# Patient Record
Sex: Female | Born: 1955
Health system: Southern US, Community
[De-identification: ages and names within clinical notes are randomized; demographics above are authoritative.]

## PROBLEM LIST (undated history)

## (undated) DIAGNOSIS — J45909 Unspecified asthma, uncomplicated: Secondary | ICD-10-CM

## (undated) DIAGNOSIS — I10 Essential (primary) hypertension: Secondary | ICD-10-CM

## (undated) HISTORY — PX: CHOLECYSTECTOMY: SHX55

## (undated) HISTORY — PX: MANDIBLE SURGERY: SHX707

---

## 1998-02-14 ENCOUNTER — Other Ambulatory Visit: Admission: RE | Admit: 1998-02-14 | Discharge: 1998-02-14 | Payer: Self-pay | Admitting: Obstetrics and Gynecology

## 1998-03-22 ENCOUNTER — Ambulatory Visit (HOSPITAL_COMMUNITY): Admission: RE | Admit: 1998-03-22 | Discharge: 1998-03-22 | Payer: Self-pay | Admitting: Obstetrics and Gynecology

## 1998-03-22 ENCOUNTER — Encounter: Payer: Self-pay | Admitting: Obstetrics and Gynecology

## 1998-06-06 ENCOUNTER — Encounter: Payer: Self-pay | Admitting: Vascular Surgery

## 1998-06-08 ENCOUNTER — Ambulatory Visit (HOSPITAL_COMMUNITY): Admission: RE | Admit: 1998-06-08 | Discharge: 1998-06-08 | Payer: Self-pay | Admitting: Vascular Surgery

## 1999-11-18 ENCOUNTER — Encounter: Admission: RE | Admit: 1999-11-18 | Discharge: 1999-11-18 | Payer: Self-pay | Admitting: *Deleted

## 1999-11-18 ENCOUNTER — Encounter: Payer: Self-pay | Admitting: *Deleted

## 2000-09-06 ENCOUNTER — Encounter: Payer: Self-pay | Admitting: Emergency Medicine

## 2000-09-06 ENCOUNTER — Emergency Department (HOSPITAL_COMMUNITY): Admission: EM | Admit: 2000-09-06 | Discharge: 2000-09-06 | Payer: Self-pay | Admitting: Emergency Medicine

## 2001-11-09 ENCOUNTER — Encounter: Admission: RE | Admit: 2001-11-09 | Discharge: 2001-12-27 | Payer: Self-pay | Admitting: Orthopedic Surgery

## 2001-11-16 ENCOUNTER — Other Ambulatory Visit: Admission: RE | Admit: 2001-11-16 | Discharge: 2001-11-16 | Payer: Self-pay | Admitting: Obstetrics and Gynecology

## 2002-07-29 ENCOUNTER — Ambulatory Visit (HOSPITAL_COMMUNITY): Admission: RE | Admit: 2002-07-29 | Discharge: 2002-07-29 | Payer: Self-pay | Admitting: Obstetrics and Gynecology

## 2002-07-29 ENCOUNTER — Encounter (INDEPENDENT_AMBULATORY_CARE_PROVIDER_SITE_OTHER): Payer: Self-pay

## 2004-08-27 ENCOUNTER — Encounter: Admission: RE | Admit: 2004-08-27 | Discharge: 2004-08-27 | Payer: Self-pay | Admitting: Obstetrics and Gynecology

## 2007-03-18 ENCOUNTER — Encounter: Admission: RE | Admit: 2007-03-18 | Discharge: 2007-03-18 | Payer: Self-pay | Admitting: Obstetrics and Gynecology

## 2008-04-26 ENCOUNTER — Encounter: Admission: RE | Admit: 2008-04-26 | Discharge: 2008-04-26 | Payer: Self-pay | Admitting: Obstetrics and Gynecology

## 2009-06-23 HISTORY — PX: ELBOW SURGERY: SHX618

## 2009-07-01 ENCOUNTER — Emergency Department (HOSPITAL_COMMUNITY): Admission: EM | Admit: 2009-07-01 | Discharge: 2009-07-01 | Payer: Self-pay | Admitting: Emergency Medicine

## 2010-02-07 ENCOUNTER — Encounter: Admission: RE | Admit: 2010-02-07 | Discharge: 2010-02-07 | Payer: Self-pay | Admitting: Obstetrics and Gynecology

## 2010-08-23 ENCOUNTER — Emergency Department (HOSPITAL_COMMUNITY)
Admission: EM | Admit: 2010-08-23 | Discharge: 2010-08-23 | Disposition: A | Discharge status: Home / Self Care | Payer: BC Managed Care – PPO | Attending: Emergency Medicine | Admitting: Emergency Medicine

## 2010-08-23 DIAGNOSIS — T7840XA Allergy, unspecified, initial encounter: Secondary | ICD-10-CM | POA: Insufficient documentation

## 2010-08-23 DIAGNOSIS — L299 Pruritus, unspecified: Secondary | ICD-10-CM | POA: Insufficient documentation

## 2010-08-23 DIAGNOSIS — J45909 Unspecified asthma, uncomplicated: Secondary | ICD-10-CM | POA: Insufficient documentation

## 2010-08-23 DIAGNOSIS — Y929 Unspecified place or not applicable: Secondary | ICD-10-CM | POA: Insufficient documentation

## 2010-08-23 DIAGNOSIS — I10 Essential (primary) hypertension: Secondary | ICD-10-CM | POA: Insufficient documentation

## 2010-11-08 NOTE — Op Note (Signed)
   NAME:  Mckenzie Whitehead, Mckenzie Whitehead                     ACCOUNT NO.:  [id number]   MEDICAL RECORD NO.:  [id number]                   PATIENT TYPE:  AMB   LOCATION:  SDC                                  FACILITY:  [facility]   PHYSICIAN:  Maxie Better, M.D.            DATE OF BIRTH:  10-Jan-1956   DATE OF PROCEDURE:  07/29/2002  DATE OF DISCHARGE:                                 OPERATIVE REPORT   PREOPERATIVE DIAGNOSES:  1. Dysfunctional uterine bleeding.  2. Endocervical mass.   PROCEDURES:  1. Diagnostic hysteroscopy.  2. Dilatation and curettage.   POSTOPERATIVE DIAGNOSIS:  Dysfunctional uterine bleeding.   ANESTHESIA:  General, paracervical block.   SURGEON:  Maxie Better, M.D.   PROCEDURE:  Under adequate general anesthesia, the patient was placed in the  dorsal lithotomy position.  Examination under anesthesia revealed a small  anteverted uterus.  No adnexal masses could be appreciated.  The patient was  sterilely prepped and draped in the usual fashion.  The bladder was  catheterized of small amount of urine.  A bivalve speculum was placed in the  vagina.  Nesacaine 1%, 10 mL, was injected paracervically at 3 and 9  o'clock.  The anterior lip of the cervix was grasped with a single-tooth  tenaculum.  The cervical os was then serially dilated up to a 25 Pratt  dilator.  A diagnostic hysteroscope was introduced into the uterine cavity  without incident.  Careful inspection including the endocervical canal and  the endometrial cavity did not reveal any masses.  The left tubal ostia was  seen.  The right was not seen.  The hysteroscope was removed.  The cavity  was then curetted.  The hysteroscope was then reinserted in the uterine  cavity and reinspection occurred without any additional findings.  At this  point, all instruments were removed from the vagina.  Specimen labeled  endometrial curetting was sent to pathology.  Estimated blood loss was  minimal.  Complications  were none.  The patient tolerated the procedure  well, was transferred to the recovery room in stable condition.                                               Maxie Better, M.D.    [place]  D:  07/29/2002  T:  07/29/2002  Job:  [id number]

## 2011-02-04 ENCOUNTER — Other Ambulatory Visit: Payer: Self-pay | Admitting: Obstetrics and Gynecology

## 2011-02-04 DIAGNOSIS — N6320 Unspecified lump in the left breast, unspecified quadrant: Secondary | ICD-10-CM

## 2011-02-10 ENCOUNTER — Ambulatory Visit
Admission: RE | Admit: 2011-02-10 | Discharge: 2011-02-10 | Disposition: A | Discharge status: Home / Self Care | Payer: BC Managed Care – PPO | Source: Ambulatory Visit | Attending: Obstetrics and Gynecology | Admitting: Obstetrics and Gynecology

## 2011-02-10 DIAGNOSIS — N6320 Unspecified lump in the left breast, unspecified quadrant: Secondary | ICD-10-CM

## 2011-03-16 ENCOUNTER — Emergency Department (HOSPITAL_COMMUNITY)
Admission: EM | Admit: 2011-03-16 | Discharge: 2011-03-16 | Disposition: A | Discharge status: Home / Self Care | Payer: BC Managed Care – PPO | Attending: Emergency Medicine | Admitting: Emergency Medicine

## 2011-03-16 DIAGNOSIS — J45909 Unspecified asthma, uncomplicated: Secondary | ICD-10-CM | POA: Insufficient documentation

## 2011-03-16 DIAGNOSIS — S91109A Unspecified open wound of unspecified toe(s) without damage to nail, initial encounter: Secondary | ICD-10-CM | POA: Insufficient documentation

## 2011-03-16 DIAGNOSIS — IMO0002 Reserved for concepts with insufficient information to code with codable children: Secondary | ICD-10-CM | POA: Insufficient documentation

## 2011-11-26 ENCOUNTER — Emergency Department (HOSPITAL_COMMUNITY)
Admission: EM | Admit: 2011-11-26 | Discharge: 2011-11-26 | Disposition: A | Discharge status: Home / Self Care | Payer: Worker's Compensation | Attending: Emergency Medicine | Admitting: Emergency Medicine

## 2011-11-26 ENCOUNTER — Encounter (HOSPITAL_COMMUNITY): Payer: Self-pay | Admitting: Family Medicine

## 2011-11-26 ENCOUNTER — Emergency Department (HOSPITAL_COMMUNITY): Payer: Worker's Compensation

## 2011-11-26 DIAGNOSIS — S8000XA Contusion of unspecified knee, initial encounter: Secondary | ICD-10-CM

## 2011-11-26 DIAGNOSIS — J45909 Unspecified asthma, uncomplicated: Secondary | ICD-10-CM | POA: Insufficient documentation

## 2011-11-26 DIAGNOSIS — R079 Chest pain, unspecified: Secondary | ICD-10-CM | POA: Insufficient documentation

## 2011-11-26 DIAGNOSIS — S7000XA Contusion of unspecified hip, initial encounter: Secondary | ICD-10-CM | POA: Insufficient documentation

## 2011-11-26 DIAGNOSIS — Y990 Civilian activity done for income or pay: Secondary | ICD-10-CM | POA: Insufficient documentation

## 2011-11-26 DIAGNOSIS — I10 Essential (primary) hypertension: Secondary | ICD-10-CM | POA: Insufficient documentation

## 2011-11-26 DIAGNOSIS — M25569 Pain in unspecified knee: Secondary | ICD-10-CM | POA: Insufficient documentation

## 2011-11-26 DIAGNOSIS — M25559 Pain in unspecified hip: Secondary | ICD-10-CM | POA: Insufficient documentation

## 2011-11-26 DIAGNOSIS — R296 Repeated falls: Secondary | ICD-10-CM | POA: Insufficient documentation

## 2011-11-26 DIAGNOSIS — S20219A Contusion of unspecified front wall of thorax, initial encounter: Secondary | ICD-10-CM

## 2011-11-26 HISTORY — DX: Essential (primary) hypertension: I10

## 2011-11-26 HISTORY — DX: Unspecified asthma, uncomplicated: J45.909

## 2011-11-26 MED ORDER — IBUPROFEN 600 MG PO TABS: 600.0000 mg | ORAL_TABLET | Freq: Four times a day (QID) | ORAL | Status: AC | PRN

## 2011-11-26 MED ORDER — HYDROCODONE-ACETAMINOPHEN 5-325 MG PO TABS: 1.0000 | ORAL_TABLET | ORAL | Status: AC | PRN

## 2011-11-26 MED ORDER — HYDROCODONE-ACETAMINOPHEN 5-325 MG PO TABS
1.0000 | ORAL_TABLET | Freq: Once | ORAL | Status: AC
  Administered 2011-11-26: 1 via ORAL
  Filled 2011-11-26: qty 1

## 2011-11-26 MED ORDER — METHOCARBAMOL 500 MG PO TABS: 500.0000 mg | ORAL_TABLET | Freq: Two times a day (BID) | ORAL | Status: AC

## 2011-11-26 NOTE — ED Notes (Signed)
Patient states she was at work [employer]), caught foot on carpet and fell. Denies LOC. C/o right shoulder pain, right rib pain, right hip and right knee.

## 2011-11-26 NOTE — ED Notes (Signed)
Patient transported to X-ray 

## 2011-11-26 NOTE — ED Provider Notes (Signed)
History     CSN: [id number]  Arrival date & time 11/26/11  1191   First MD Initiated Contact with Patient 11/26/11 2203      Chief Complaint  Patient presents with  . Fall    (Consider location/radiation/quality/duration/timing/severity/associated sxs/prior treatment) HPI Pt with trip and fall from standing position onto right side today at work. No LOC, head or neck injury. No focal weakness, numbness. Pt c/o R side rib pain, R hip pain, R knee pain. No abd pain.  Past Medical History  Diagnosis Date  . Asthma   . Hypertension     Past Surgical History  Procedure Date  . Elbow surgery 2011  . Mandible surgery   . Cholecystectomy     No family history on file.  History  Substance Use Topics  . Smoking status: Not on file  . Smokeless tobacco: Not on file  . Alcohol Use: Yes    OB History    Grav Para Term Preterm Abortions TAB SAB Ect Mult Living                  Review of Systems  Constitutional: Negative for fever and chills.  HENT: Negative for neck pain and neck stiffness.   Eyes: Negative for visual disturbance.  Respiratory: Negative for cough and shortness of breath.   Cardiovascular: Negative for chest pain, palpitations and leg swelling.  Gastrointestinal: Negative for nausea, vomiting, abdominal pain and diarrhea.  Musculoskeletal: Positive for arthralgias. Negative for back pain.  Skin: Positive for wound. Negative for pallor and rash.  Neurological: Negative for dizziness, syncope, weakness, light-headedness, numbness and headaches.    Allergies  Review of patient's allergies indicates no known allergies.  Home Medications   Current Outpatient Rx  Name Route Sig Dispense Refill  . ALBUTEROL SULFATE HFA 108 (90 BASE) MCG/ACT IN AERS Inhalation Inhale 2 puffs into the lungs every 6 (six) hours as needed. Shortness of breath    . FEXOFENADINE HCL 30 MG PO TABS Oral Take 30 mg by mouth 2 (two) times daily.    Marland Kitchen FLUTICASONE-SALMETEROL 115-21  MCG/ACT IN AERO Inhalation Inhale 2 puffs into the lungs 2 (two) times daily.    Marland Kitchen LISINOPRIL 10 MG PO TABS Oral Take 10 mg by mouth daily.    Marland Kitchen MELATONIN 1 MG PO TABS Oral Take 1 tablet by mouth every evening.    . ADULT MULTIVITAMIN W/MINERALS CH Oral Take 1 tablet by mouth daily.    Marland Kitchen HYDROCODONE-ACETAMINOPHEN 5-325 MG PO TABS Oral Take 1 tablet by mouth every 4 (four) hours as needed for pain. 15 tablet 0  . IBUPROFEN 600 MG PO TABS Oral Take 1 tablet (600 mg total) by mouth every 6 (six) hours as needed for pain. 30 tablet 0  . METHOCARBAMOL 500 MG PO TABS Oral Take 1 tablet (500 mg total) by mouth 2 (two) times daily. 20 tablet 0    BP 150/68  Pulse 87  Temp(Src) 97.9 F (36.6 C) (Oral)  Resp 18  SpO2 98%  Physical Exam  Nursing note and vitals reviewed. Constitutional: She is oriented to person, place, and time. She appears well-developed and well-nourished. No distress.  HENT:  Head: Normocephalic and atraumatic.  Mouth/Throat: Oropharynx is clear and moist.  Eyes: EOM are normal. Pupils are equal, round, and reactive to light.  Neck: Normal range of motion. Neck supple.       No posterior midline cervical TTP. FROM  Cardiovascular: Normal rate and regular rhythm.   Pulmonary/Chest:  Effort normal and breath sounds normal. No respiratory distress. She has no wheezes. She has no rales. She exhibits tenderness (TTP over anterior/lateral R chest wall. No crepitance or deformity. ).  Abdominal: Soft. Bowel sounds are normal. She exhibits no distension. There is no tenderness. There is no rebound and no guarding.  Musculoskeletal: Normal range of motion. She exhibits no edema and no tenderness.       TTP over lateral surface of R hip. ROM of R hip. TTP over lateral surface of R knee. FROM. No laxity. 2+DP pulses.   Neurological: She is alert and oriented to person, place, and time.       5/5 motor in all ext. Sensation intact  Skin: Skin is warm and dry. No rash noted. No erythema.    Psychiatric: She has a normal mood and affect. Her behavior is normal.    ED Course  Procedures (including critical care time)  Labs Reviewed - No data to display Dg Ribs Unilateral W/chest Right  11/26/2011  *RADIOLOGY REPORT*  Clinical Data: History of fall complaining of right-sided chest pain.  RIGHT RIBS AND CHEST - 3+ VIEW  Comparison: No priors.  Findings: Lung volumes are normal.  No consolidative airspace disease.  No pleural effusions.  No pneumothorax.  No pulmonary nodule or mass noted.  Pulmonary vasculature and the cardiomediastinal silhouette are within normal limits.  Dedicated views of the right ribs demonstrate no definite acute displaced rib fractures.  Surgical clips project over the right upper quadrant of the abdomen, suggesting prior cholecystectomy.  IMPRESSION: 1.  No radiographic evidence of acute cardiopulmonary disease. 2.  No acute displaced rib fractures identified.  Should the patient's symptoms persist or worsen, repeat radiographs of the ribs in 10 - 14 days may be of use, as many acute nondisplaced rib fractures can be occult on initial imaging.  Original Report Authenticated By: Florencia Reasons, M.D.   Dg Hip Complete Right  11/26/2011  *RADIOLOGY REPORT*  Clinical Data: Larey Seat.  Right hip pain.  RIGHT HIP - COMPLETE 2+ VIEW  Comparison: None  Findings: The hips are normally located.  No acute fracture.  No plain film findings for avascular necrosis.  The pubic symphysis and SI joints are intact.  No pelvic fractures.  IMPRESSION: No acute bony findings.  Original Report Authenticated By: P. Loralie Champagne, M.D.   Dg Knee 2 Views Right  11/26/2011  *RADIOLOGY REPORT*  Clinical Data: Larey Seat.  Right knee pain.  RIGHT KNEE - 1-2 VIEW  Comparison: None  Findings: The joint spaces are maintained.  No acute fracture or osteochondral abnormality.  No joint effusion.  IMPRESSION: No acute bony findings.  Original Report Authenticated By: P. Loralie Champagne, M.D.     1. Rib  contusion   2. Contusion, hip   3. Knee contusion       MDM          Loren Racer, MD 11/26/11 2311

## 2011-11-26 NOTE — Discharge Instructions (Signed)
Chest Contusion You have been checked for injuries to your chest. Your caregiver has not found injuries serious enough to require hospitalization. It is common to have bruises and sore muscles after an injury. These tend to feel worse the first 24 hours. You may gradually develop more stiffness and soreness over the next several hours to several days. This usually feels worse the first morning following your injury. After a few days, you will usually begin to improve. The amount of improvement depends on the amount of damage. Following the accident, if the pain in any area continues to increase or you develop new areas of pain, you should see your primary caregiver or return to the Emergency Department for re-evaluation. HOME CARE INSTRUCTIONS   Put ice on sore areas every 2 hours for 20 minutes while awake for the next 2 days.   Drink extra fluids. Do not drink alcohol.   Activity as tolerated. Lifting may make pain worse.   Only take over-the-counter or prescription medicines for pain, discomfort, or fever as directed by your caregiver. Do not use aspirin. This may increase bruising or increase bleeding.  SEEK IMMEDIATE MEDICAL CARE IF:   There is a worsening of any of the problems that brought you in for care.   Shortness of breath, dizziness or fainting develop.   You have chest pain, difficulty breathing, or develop pain going down the left arm or up into jaw.   You feel sick to your stomach (nausea), vomiting or sweats.   You have increasing belly (abdominal) discomfort.   There is blood in your urine, stool, or if you vomit blood.   There is pain in either shoulder in an area where a shoulder strap would be.   You have feelings of lightheadedness, or if you should have a fainting episode.   You have numbness, tingling, weakness, or problems with the use of your arms or legs.   Severe headaches not relieved with medications develop.   You have a change in bowel or bladder  control.   There is increasing pain in any areas of the body.  If you feel your symptoms are worsening, and you are not able to see your primary caregiver, return to the Emergency Department immediately. MAKE SURE YOU:   Understand these instructions.   Will watch your condition.   Will get help right away if you are not doing well or get worse.  Document Released: 03/04/2001 Document Revised: 05/29/2011 Document Reviewed: 01/26/2008 [employer] Patient Information 2012 [employer]. 

## 2012-01-19 ENCOUNTER — Emergency Department (HOSPITAL_COMMUNITY)
Admission: EM | Admit: 2012-01-19 | Discharge: 2012-01-19 | Disposition: A | Discharge status: Home / Self Care | Payer: 59 | Source: Home / Self Care

## 2012-01-19 ENCOUNTER — Encounter (HOSPITAL_COMMUNITY): Payer: Self-pay

## 2012-01-19 DIAGNOSIS — R0982 Postnasal drip: Secondary | ICD-10-CM

## 2012-01-19 DIAGNOSIS — J069 Acute upper respiratory infection, unspecified: Secondary | ICD-10-CM

## 2012-01-19 DIAGNOSIS — R05 Cough: Secondary | ICD-10-CM

## 2012-01-19 MED ORDER — CETIRIZINE-PSEUDOEPHEDRINE ER 5-120 MG PO TB12: 1.0000 | ORAL_TABLET | Freq: Every day | ORAL | Status: AC

## 2012-01-19 MED ORDER — SALINE NASAL SPRAY 0.65 % NA SOLN: 1.0000 | NASAL | Status: DC | PRN

## 2012-01-19 MED ORDER — AZITHROMYCIN 250 MG PO TABS: 250.0000 mg | ORAL_TABLET | Freq: Every day | ORAL | Status: DC

## 2012-01-19 MED ORDER — AZITHROMYCIN 250 MG PO TABS: 250.0000 mg | ORAL_TABLET | Freq: Every day | ORAL | Status: AC

## 2012-01-19 NOTE — ED Notes (Signed)
Started yesterday with breathing problems and cough, nasal stuffiness started after the cough; no significant wheezing noted on ascultation; no relief w OTC medication

## 2012-01-19 NOTE — ED Provider Notes (Signed)
History     CSN: [id number]  Arrival date & time 01/19/12  1807   None     Chief Complaint  Patient presents with  . Pleurisy    (Consider location/radiation/quality/duration/timing/severity/associated sxs/prior treatment) The history is provided by the patient and a relative.  Mckenzie Whitehead is a 56 y.o. female who complains of onset of cold symptoms since yesterday.  No sore throat + cough, non productive No pleuritic pain No wheezing + nasal congestion + post-nasal drainage + sinus pain/pressure No laryngitis + chest congestion No itchy/red eyes Bilateral ear fullness No hemoptysis No SOB No chills/sweats No fever + nausea No vomiting No abdominal pain No diarrhea No skin rashes No fatigue No myalgias No headache  No ill contacts   Past Medical History  Diagnosis Date  . Asthma   . Hypertension     Past Surgical History  Procedure Date  . Elbow surgery 2011  . Mandible surgery   . Cholecystectomy     History reviewed. No pertinent family history.  History  Substance Use Topics  . Smoking status: Not on file  . Smokeless tobacco: Not on file  . Alcohol Use: Yes    OB History    Grav Para Term Preterm Abortions TAB SAB Ect Mult Living                  Review of Systems  All other systems reviewed and are negative.    Allergies  Review of patient's allergies indicates no known allergies.  Home Medications   Current Outpatient Rx  Name Route Sig Dispense Refill  . ALBUTEROL SULFATE HFA 108 (90 BASE) MCG/ACT IN AERS Inhalation Inhale 2 puffs into the lungs every 6 (six) hours as needed. Shortness of breath    . FEXOFENADINE HCL 30 MG PO TABS Oral Take 30 mg by mouth 2 (two) times daily.    Marland Kitchen FLUTICASONE-SALMETEROL 115-21 MCG/ACT IN AERO Inhalation Inhale 2 puffs into the lungs 2 (two) times daily.    Marland Kitchen LISINOPRIL 10 MG PO TABS Oral Take 10 mg by mouth daily.    Marland Kitchen MELATONIN 1 MG PO TABS Oral Take 1 tablet by mouth every evening.    . ADULT  MULTIVITAMIN W/MINERALS CH Oral Take 1 tablet by mouth daily.    Marland Kitchen CETIRIZINE-PSEUDOEPHEDRINE ER 5-120 MG PO TB12 Oral Take 1 tablet by mouth daily. 30 tablet 0  . SALINE NASAL SPRAY 0.65 % NA SOLN Nasal Place 1 spray into the nose as needed for congestion. 30 mL 12  . ZOLMITRIPTAN 2.5 MG PO TABS Oral Take 2.5 mg by mouth as needed.      BP 162/75  Pulse 100  Temp 99.3 F (37.4 C) (Oral)  Resp 18  SpO2 99%  Physical Exam  Nursing note and vitals reviewed. Constitutional: She is oriented to person, place, and time. Vital signs are normal. She appears well-developed and well-nourished. She is active and cooperative.  HENT:  Head: Normocephalic.  Right Ear: Hearing, external ear and ear canal normal. Tympanic membrane is bulging.  Left Ear: Hearing, tympanic membrane, external ear and ear canal normal.  Nose: Nose normal. Right sinus exhibits no maxillary sinus tenderness and no frontal sinus tenderness. Left sinus exhibits no maxillary sinus tenderness and no frontal sinus tenderness.  Mouth/Throat: Uvula is midline, oropharynx is clear and moist and mucous membranes are normal.  Eyes: Conjunctivae are normal. Pupils are equal, round, and reactive to light. No scleral icterus.  Neck: Trachea normal. Neck supple.  Cardiovascular:  Normal rate, regular rhythm, normal heart sounds and normal pulses.   Pulmonary/Chest: Effort normal and breath sounds normal. Not tachypneic. No respiratory distress. She exhibits no tenderness.  Abdominal: Soft. Normal appearance and bowel sounds are normal. There is no tenderness.  Lymphadenopathy:       Head (right side): No submental, no submandibular, no tonsillar, no preauricular, no posterior auricular and no occipital adenopathy present.       Head (left side): No submental, no submandibular, no tonsillar, no preauricular, no posterior auricular and no occipital adenopathy present.    She has no cervical adenopathy.    She has no axillary adenopathy.        Left occipital tenderness  Neurological: She is alert and oriented to person, place, and time. No cranial nerve deficit or sensory deficit.  Skin: Skin is warm and dry.  Psychiatric: She has a normal mood and affect. Her speech is normal and behavior is normal. Judgment and thought content normal. Cognition and memory are normal.    ED Course  Procedures (including critical care time)  Labs Reviewed - No data to display No results found.   1. URI (upper respiratory infection)   2. Cough   3. Postnasal drip       MDM  Increase fluid intake, rest.  Begin Azithromycin in 3-5 days if symptoms are not improved.  Begin expectorant/decongestant, topical decongestant, saline nasal spray and/or saline irrigation, and cough suppressant at bedtime. Antihistamines of your choice (Claritin or Zyrtec).  Tylenol or Motrin for fever/discomfort.  Followup with PCP if not improving 7 to 10 days.       Johnsie Kindred, NP 01/19/12 2120

## 2012-01-20 NOTE — ED Provider Notes (Signed)
Medical screening examination/treatment/procedure(s) were performed by non-physician practitioner and as supervising physician I was immediately available for consultation/collaboration.  Leslee Home, M.D.  Reuben Likes, MD 01/20/12 [phone]

## 2012-03-17 ENCOUNTER — Ambulatory Visit
Admission: RE | Admit: 2012-03-17 | Discharge: 2012-03-17 | Disposition: A | Discharge status: Home / Self Care | Payer: 59 | Source: Ambulatory Visit | Attending: Obstetrics and Gynecology | Admitting: Obstetrics and Gynecology

## 2012-03-17 ENCOUNTER — Other Ambulatory Visit: Payer: Self-pay | Admitting: Obstetrics and Gynecology

## 2012-03-17 DIAGNOSIS — Z1231 Encounter for screening mammogram for malignant neoplasm of breast: Secondary | ICD-10-CM

## 2013-07-11 ENCOUNTER — Other Ambulatory Visit: Payer: Self-pay

## 2013-07-11 DIAGNOSIS — Z1231 Encounter for screening mammogram for malignant neoplasm of breast: Secondary | ICD-10-CM

## 2013-07-29 ENCOUNTER — Ambulatory Visit: Payer: Self-pay

## 2013-10-20 ENCOUNTER — Ambulatory Visit
Admission: RE | Admit: 2013-10-20 | Discharge: 2013-10-20 | Disposition: A | Discharge status: Home / Self Care | Payer: 59 | Source: Ambulatory Visit

## 2013-10-20 ENCOUNTER — Encounter (INDEPENDENT_AMBULATORY_CARE_PROVIDER_SITE_OTHER): Payer: Self-pay

## 2013-10-20 DIAGNOSIS — Z1231 Encounter for screening mammogram for malignant neoplasm of breast: Secondary | ICD-10-CM

## 2014-10-20 ENCOUNTER — Other Ambulatory Visit: Payer: Self-pay | Admitting: Otolaryngology

## 2014-10-20 DIAGNOSIS — H903 Sensorineural hearing loss, bilateral: Secondary | ICD-10-CM

## 2016-06-09 DIAGNOSIS — J40 Bronchitis, not specified as acute or chronic: Secondary | ICD-10-CM | POA: Diagnosis not present

## 2016-06-09 DIAGNOSIS — J329 Chronic sinusitis, unspecified: Secondary | ICD-10-CM | POA: Diagnosis not present

## 2016-06-25 DIAGNOSIS — J302 Other seasonal allergic rhinitis: Secondary | ICD-10-CM | POA: Diagnosis not present

## 2016-06-25 DIAGNOSIS — J40 Bronchitis, not specified as acute or chronic: Secondary | ICD-10-CM | POA: Diagnosis not present

## 2016-06-25 DIAGNOSIS — J4540 Moderate persistent asthma, uncomplicated: Secondary | ICD-10-CM | POA: Diagnosis not present

## 2016-06-25 DIAGNOSIS — J329 Chronic sinusitis, unspecified: Secondary | ICD-10-CM | POA: Diagnosis not present

## 2016-07-04 DIAGNOSIS — J40 Bronchitis, not specified as acute or chronic: Secondary | ICD-10-CM | POA: Diagnosis not present

## 2016-07-04 DIAGNOSIS — I10 Essential (primary) hypertension: Secondary | ICD-10-CM | POA: Diagnosis not present

## 2016-07-04 DIAGNOSIS — J329 Chronic sinusitis, unspecified: Secondary | ICD-10-CM | POA: Diagnosis not present

## 2016-07-04 DIAGNOSIS — Z1389 Encounter for screening for other disorder: Secondary | ICD-10-CM | POA: Diagnosis not present

## 2016-08-28 ENCOUNTER — Encounter (HOSPITAL_COMMUNITY): Payer: Self-pay | Admitting: Emergency Medicine

## 2016-08-28 DIAGNOSIS — X58XXXA Exposure to other specified factors, initial encounter: Secondary | ICD-10-CM | POA: Diagnosis not present

## 2016-08-28 DIAGNOSIS — I10 Essential (primary) hypertension: Secondary | ICD-10-CM | POA: Diagnosis not present

## 2016-08-28 DIAGNOSIS — Y929 Unspecified place or not applicable: Secondary | ICD-10-CM | POA: Insufficient documentation

## 2016-08-28 DIAGNOSIS — Y999 Unspecified external cause status: Secondary | ICD-10-CM | POA: Diagnosis not present

## 2016-08-28 DIAGNOSIS — Z79899 Other long term (current) drug therapy: Secondary | ICD-10-CM | POA: Diagnosis not present

## 2016-08-28 DIAGNOSIS — J45909 Unspecified asthma, uncomplicated: Secondary | ICD-10-CM | POA: Diagnosis not present

## 2016-08-28 DIAGNOSIS — R109 Unspecified abdominal pain: Secondary | ICD-10-CM | POA: Diagnosis not present

## 2016-08-28 DIAGNOSIS — S76919A Strain of unspecified muscles, fascia and tendons at thigh level, unspecified thigh, initial encounter: Secondary | ICD-10-CM | POA: Insufficient documentation

## 2016-08-28 DIAGNOSIS — S79929A Unspecified injury of unspecified thigh, initial encounter: Secondary | ICD-10-CM | POA: Diagnosis present

## 2016-08-28 DIAGNOSIS — Y939 Activity, unspecified: Secondary | ICD-10-CM | POA: Diagnosis not present

## 2016-08-28 DIAGNOSIS — S39011A Strain of muscle, fascia and tendon of abdomen, initial encounter: Secondary | ICD-10-CM | POA: Diagnosis not present

## 2016-08-28 LAB — URINALYSIS, ROUTINE W REFLEX MICROSCOPIC
BILIRUBIN URINE: NEGATIVE
Glucose, UA: NEGATIVE mg/dL
Hgb urine dipstick: NEGATIVE
KETONES UR: NEGATIVE mg/dL
NITRITE: NEGATIVE
PH: 8 (ref 5.0–8.0)
Protein, ur: NEGATIVE mg/dL
Specific Gravity, Urine: 1.008 (ref 1.005–1.030)

## 2016-08-28 LAB — CBC
HCT: 36.4 % (ref 36.0–46.0)
Hemoglobin: 12.2 g/dL (ref 12.0–15.0)
MCH: 32 pg (ref 26.0–34.0)
MCHC: 33.5 g/dL (ref 30.0–36.0)
MCV: 95.5 fL (ref 78.0–100.0)
PLATELETS: 250 10*3/uL (ref 150–400)
RBC: 3.81 MIL/uL — AB (ref 3.87–5.11)
RDW: 13.6 % (ref 11.5–15.5)
WBC: 5.9 10*3/uL (ref 4.0–10.5)

## 2016-08-28 NOTE — ED Triage Notes (Signed)
Pt presents with left flank pain that began yesterday and intermittently worsening and moved to LLQ; pt states she is going to the restroom more frequently but denies hematuria; pt also reporting intermittent nausea and diarrhea but does not know if this is related to pain; pt denies fevers, CP, sob

## 2016-08-29 ENCOUNTER — Emergency Department (HOSPITAL_COMMUNITY)
Admission: EM | Admit: 2016-08-29 | Discharge: 2016-08-29 | Disposition: A | Discharge status: Home / Self Care | Payer: BLUE CROSS/BLUE SHIELD | Attending: Emergency Medicine | Admitting: Emergency Medicine

## 2016-08-29 ENCOUNTER — Emergency Department (HOSPITAL_COMMUNITY): Payer: BLUE CROSS/BLUE SHIELD

## 2016-08-29 ENCOUNTER — Encounter (HOSPITAL_COMMUNITY): Payer: Self-pay

## 2016-08-29 DIAGNOSIS — R109 Unspecified abdominal pain: Secondary | ICD-10-CM | POA: Diagnosis not present

## 2016-08-29 DIAGNOSIS — S76219A Strain of adductor muscle, fascia and tendon of unspecified thigh, initial encounter: Secondary | ICD-10-CM

## 2016-08-29 LAB — BASIC METABOLIC PANEL
ANION GAP: 6 (ref 5–15)
BUN: 11 mg/dL (ref 6–20)
CO2: 27 mmol/L (ref 22–32)
Calcium: 9 mg/dL (ref 8.9–10.3)
Chloride: 103 mmol/L (ref 101–111)
Creatinine, Ser: 0.81 mg/dL (ref 0.44–1.00)
GLUCOSE: 99 mg/dL (ref 65–99)
POTASSIUM: 3.9 mmol/L (ref 3.5–5.1)
Sodium: 136 mmol/L (ref 135–145)

## 2016-08-29 MED ORDER — HYDROCODONE-ACETAMINOPHEN 5-325 MG PO TABS
1.0000 | ORAL_TABLET | Freq: Once | ORAL | Status: AC
  Administered 2016-08-29: 1 via ORAL
  Filled 2016-08-29: qty 1

## 2016-08-29 MED ORDER — NAPROXEN 375 MG PO TABS: 375.0000 mg | ORAL_TABLET | Freq: Two times a day (BID) | ORAL | 0 refills | Status: DC

## 2016-08-29 MED ORDER — HYDROCODONE-ACETAMINOPHEN 5-325 MG PO TABS: 1.0000 | ORAL_TABLET | ORAL | 0 refills | Status: DC | PRN

## 2016-08-29 MED ORDER — MORPHINE SULFATE (PF) 4 MG/ML IV SOLN
2.0000 mg | Freq: Once | INTRAVENOUS | Status: AC
  Administered 2016-08-29: 2 mg via INTRAVENOUS
  Filled 2016-08-29: qty 1

## 2016-08-29 MED ORDER — HYDROCODONE-ACETAMINOPHEN 5-325 MG PO TABS: 2.0000 | ORAL_TABLET | ORAL | 0 refills | Status: DC | PRN

## 2016-08-29 MED ORDER — ONDANSETRON HCL 4 MG/2ML IJ SOLN
4.0000 mg | Freq: Once | INTRAMUSCULAR | Status: AC
  Administered 2016-08-29: 4 mg via INTRAVENOUS
  Filled 2016-08-29: qty 2

## 2016-08-29 MED ORDER — SODIUM CHLORIDE 0.9 % IV BOLUS (SEPSIS)
500.0000 mL | Freq: Once | INTRAVENOUS | Status: AC
  Administered 2016-08-29: 500 mL via INTRAVENOUS

## 2016-08-29 MED ORDER — IOPAMIDOL (ISOVUE-300) INJECTION 61%
INTRAVENOUS | Status: AC
  Administered 2016-08-29: 100 mL
  Filled 2016-08-29: qty 100

## 2016-08-29 NOTE — ED Provider Notes (Signed)
[facility] Provider Note   CSN: [id number] Arrival date & time: 08/28/16  2315  By signing my name below, I, Cynda AcresHailei Fulton, attest that this documentation has been prepared under the direction and in the presence of Gilda Creasehristopher J Otto Felkins, MD. Electronically Signed: Cynda AcresHailei Fulton, Scribe. 08/29/16. 1:44 AM.  History   Chief Complaint Chief Complaint  Patient presents with  . Flank Pain    L    HPI Comments: Mckenzie Whitehead is a 61 y.o. female with a history of kidney stones, who presents to the Emergency Department complaining of sudden-onset, gradually worsening left flank pain that began yesterday. Patient states her pain has intermittently moved to the LLQ. Patient reports associated urinary frequency, nausea, and diarrhea. Patient describes her pain as sharp with a severity of 9/10. Patient states her pain is worse with sitting or standing. Patient reports using a heating pad with no relief in pain. Patient denies any dysuria, hematuria, fever, or vomiting. Patient denies a history of diverticulitis or colitis.    The history is provided by the patient. No language interpreter was used.    Past Medical History:  Diagnosis Date  . Asthma   . Hypertension     There are no active problems to display for this patient.   Past Surgical History:  Procedure Laterality Date  . CHOLECYSTECTOMY    . ELBOW SURGERY  2011  . MANDIBLE SURGERY      OB History    No data available       Home Medications    Prior to Admission medications   Medication Sig Start Date End Date Taking? Authorizing Provider  albuterol (PROVENTIL HFA;VENTOLIN HFA) 108 (90 BASE) MCG/ACT inhaler Inhale 2 puffs into the lungs every 6 (six) hours as needed. Shortness of breath   Yes Historical Provider, MD  fluticasone (FLONASE) 50 MCG/ACT nasal spray Place 1 spray into both nostrils daily.   Yes Historical Provider, MD  loratadine (CLARITIN) 10 MG tablet Take 10 mg by mouth daily.   Yes  Historical Provider, MD  methocarbamol (ROBAXIN) 500 MG tablet Take 500 mg by mouth every 8 (eight) hours as needed for muscle spasms.   Yes Historical Provider, MD  montelukast (SINGULAIR) 10 MG tablet Take 10 mg by mouth at bedtime.   Yes Historical Provider, MD  Multiple Vitamin (MULITIVITAMIN WITH MINERALS) TABS Take 1 tablet by mouth daily.   Yes Historical Provider, MD  HYDROcodone-acetaminophen (NORCO/VICODIN) 5-325 MG tablet Take 1 tablet by mouth every 4 (four) hours as needed for moderate pain. 08/29/16   Gilda Creasehristopher J Grayden Burley, MD  naproxen (NAPROSYN) 375 MG tablet Take 1 tablet (375 mg total) by mouth 2 (two) times daily. 08/29/16   Gilda Creasehristopher J Lashaunda Schild, MD  sodium chloride (OCEAN NASAL SPRAY) 0.65 % nasal spray Place 1 spray into the nose as needed for congestion. Patient not taking: Reported on 08/29/2016 01/19/12 08/29/16  Johnsie Kindredarmen L Chatten, NP    Family History History reviewed. No pertinent family history.  Social History Social History  Substance Use Topics  . Smoking status: Never Smoker  . Smokeless tobacco: Never Used  . Alcohol use No     Allergies   Gluten meal   Review of Systems Review of Systems  Constitutional: Negative for fever.  Respiratory: Negative for shortness of breath.   Gastrointestinal: Positive for diarrhea and nausea. Negative for vomiting.  Genitourinary: Positive for flank pain (left) and frequency. Negative for dysuria and hematuria.  All other systems reviewed and are negative.  Physical Exam Updated Vital Signs BP 157/86   Pulse 79   Temp 97.6 F (36.4 C) (Oral)   Resp 16   Ht 5\' 7"  (1.702 m)   Wt 154 lb (69.9 kg)   SpO2 97%   BMI 24.12 kg/m   Physical Exam  Constitutional: She is oriented to person, place, and time. She appears well-developed and well-nourished. No distress.  HENT:  Head: Normocephalic and atraumatic.  Right Ear: Hearing normal.  Left Ear: Hearing normal.  Nose: Nose normal.  Mouth/Throat: Oropharynx is  clear and moist and mucous membranes are normal.  Eyes: Conjunctivae and EOM are normal. Pupils are equal, round, and reactive to light.  Neck: Normal range of motion. Neck supple.  Cardiovascular: Regular rhythm, S1 normal and S2 normal.  Exam reveals no gallop and no friction rub.   No murmur heard. Pulmonary/Chest: Effort normal and breath sounds normal. No respiratory distress. She exhibits no tenderness.  Abdominal: Soft. Normal appearance and bowel sounds are normal. There is no hepatosplenomegaly. There is tenderness. There is no rebound, no guarding, no tenderness at McBurney's point and negative Murphy's sign. No hernia.  Inguinal crease tenderness with no obvious mass.   Musculoskeletal: Normal range of motion.  Neurological: She is alert and oriented to person, place, and time. She has normal strength. No cranial nerve deficit or sensory deficit. Coordination normal. GCS eye subscore is 4. GCS verbal subscore is 5. GCS motor subscore is 6.  Skin: Skin is warm, dry and intact. No rash noted. No cyanosis.  Psychiatric: She has a normal mood and affect. Her speech is normal and behavior is normal. Thought content normal.  Nursing note and vitals reviewed.    ED Treatments / Results  DIAGNOSTIC STUDIES: Oxygen Saturation is 100% on RA, normal by my interpretation.    COORDINATION OF CARE: 1:44 AM Discussed treatment plan with pt at bedside and pt agreed to plan, which includes an abdominal CT, IV fluids, and pain medication.   Labs (all labs ordered are listed, but only abnormal results are displayed) Labs Reviewed  URINALYSIS, ROUTINE W REFLEX MICROSCOPIC - Abnormal; Notable for the following:       Result Value   Color, Urine STRAW (*)    APPearance HAZY (*)    Leukocytes, UA SMALL (*)    Bacteria, UA RARE (*)    Squamous Epithelial / LPF 0-5 (*)    All other components within normal limits  CBC - Abnormal; Notable for the following:    RBC 3.81 (*)    All other  components within normal limits  BASIC METABOLIC PANEL    EKG  EKG Interpretation None       Radiology Ct Abdomen Pelvis W Contrast  Result Date: 08/29/2016 CLINICAL DATA:  Left flank pain, intermittent and worsening since yesterday. EXAM: CT ABDOMEN AND PELVIS WITH CONTRAST TECHNIQUE: Multidetector CT imaging of the abdomen and pelvis was performed using the standard protocol following bolus administration of intravenous contrast. CONTRAST:  ISOVUE-300 IOPAMIDOL (ISOVUE-300) INJECTION 61% COMPARISON:  None. FINDINGS: Lower chest: No acute abnormality. Hepatobiliary: No focal liver abnormality is seen. Status post cholecystectomy. No biliary dilatation. Pancreas: Unremarkable. No pancreatic ductal dilatation or surrounding inflammatory changes. Spleen: Normal spleen size. 8 mm hypodensity in the inferior margin is indeterminate but more likely benign. Adrenals/Urinary Tract: Adrenal glands are unremarkable. Kidneys are normal, without renal calculi, focal lesion, or hydronephrosis. Bladder is unremarkable. Stomach/Bowel: Large hiatal hernia. Stomach and small bowel are otherwise unremarkable. Appendix is normal.  Colon is remarkable only for uncomplicated mild diverticulosis. Vascular/Lymphatic: No significant vascular findings are present. No enlarged abdominal or pelvic lymph nodes. Reproductive: Uterus and bilateral adnexa are unremarkable. Other: No focal inflammation.  No ascites. Musculoskeletal: No significant skeletal lesion. IMPRESSION: 1. Large hiatal hernia. 2. Uncomplicated mild diverticulosis. 3. Subcentimeter focal hypodensity of the spleen, more likely benign but not conclusively characterized. Electronically Signed   By: Ellery Plunk M.D.   On: 08/29/2016 03:05    Procedures Procedures (including critical care time)  Medications Ordered in ED Medications  sodium chloride 0.9 % bolus 500 mL (0 mLs Intravenous Stopped 08/29/16 0317)  ondansetron (ZOFRAN) injection 4 mg (4  mg Intravenous Given 08/29/16 0206)  morphine 4 MG/ML injection 2 mg (2 mg Intravenous Given 08/29/16 0206)  iopamidol (ISOVUE-300) 61 % injection (100 mLs  Contrast Given 08/29/16 0244)     Initial Impression / Assessment and Plan / ED Course  I have reviewed the triage vital signs and the nursing notes.  Pertinent labs & imaging results that were available during my care of the patient were reviewed by me and considered in my medical decision making (see chart for details).     Patient presents to the emergency room with complaints of pain in the left flank area. Examination reveals tenderness in the left inguinal crease area without obvious hernia, lymphadenopathy, signs of infection. Urinalysis does not suggest infection. Blood work is normal. CT scan performed, no evidence of abnormality noted. Workup plus examination is most consistent with musculoskeletal pain, possibly muscle strain in the groin region. Will treat with rest and analgesia, follow up with primary doctor.  Final Clinical Impressions(s) / ED Diagnoses   Final diagnoses:  Groin strain, initial encounter    New Prescriptions New Prescriptions   HYDROCODONE-ACETAMINOPHEN (NORCO/VICODIN) 5-325 MG TABLET    Take 1 tablet by mouth every 4 (four) hours as needed for moderate pain.   NAPROXEN (NAPROSYN) 375 MG TABLET    Take 1 tablet (375 mg total) by mouth 2 (two) times daily.   I personally performed the services described in this documentation, which was scribed in my presence. The recorded information has been reviewed and is accurate.     Gilda Crease, MD 08/29/16 [phone]

## 2016-08-30 ENCOUNTER — Encounter (HOSPITAL_COMMUNITY): Payer: Self-pay | Admitting: Emergency Medicine

## 2016-08-30 ENCOUNTER — Ambulatory Visit (HOSPITAL_COMMUNITY)
Admission: EM | Admit: 2016-08-30 | Discharge: 2016-08-30 | Disposition: A | Discharge status: Home / Self Care | Payer: BLUE CROSS/BLUE SHIELD | Attending: Internal Medicine | Admitting: Internal Medicine

## 2016-08-30 DIAGNOSIS — B028 Zoster with other complications: Secondary | ICD-10-CM | POA: Diagnosis not present

## 2016-08-30 MED ORDER — ACYCLOVIR 400 MG PO TABS: 800.0000 mg | ORAL_TABLET | Freq: Every day | ORAL | 0 refills | Status: AC

## 2016-08-30 NOTE — ED Provider Notes (Signed)
[facility]    CSN: [id number] Arrival date & time: 08/30/16  1753     History   Chief Complaint Chief Complaint  Patient presents with  . Herpes Zoster    HPI Mckenzie Whitehead is a 61 y.o. female.   Pt seen in the ED yesterday for flank pain. Dx w/ abdominal wall muscle strain after CT scan negative.  Today developed painful burning skin w/ blistering lesion from groin across hip around to her back.      Past Medical History:  Diagnosis Date  . Asthma   . Hypertension     There are no active problems to display for this patient.   Past Surgical History:  Procedure Laterality Date  . CHOLECYSTECTOMY    . ELBOW SURGERY  2011  . MANDIBLE SURGERY      OB History    No data available       Home Medications    Prior to Admission medications   Medication Sig Start Date End Date Taking? Authorizing Provider  albuterol (PROVENTIL HFA;VENTOLIN HFA) 108 (90 BASE) MCG/ACT inhaler Inhale 2 puffs into the lungs every 6 (six) hours as needed. Shortness of breath   Yes Historical Provider, MD  fluticasone (FLONASE) 50 MCG/ACT nasal spray Place 1 spray into both nostrils daily.   Yes Historical Provider, MD  HYDROcodone-acetaminophen (NORCO/VICODIN) 5-325 MG tablet Take 1 tablet by mouth every 4 (four) hours as needed for moderate pain. 08/29/16  Yes Gilda Crease, MD  loratadine (CLARITIN) 10 MG tablet Take 10 mg by mouth daily.   Yes Historical Provider, MD  methocarbamol (ROBAXIN) 500 MG tablet Take 500 mg by mouth every 8 (eight) hours as needed for muscle spasms.   Yes Historical Provider, MD  montelukast (SINGULAIR) 10 MG tablet Take 10 mg by mouth at bedtime.   Yes Historical Provider, MD  Multiple Vitamin (MULITIVITAMIN WITH MINERALS) TABS Take 1 tablet by mouth daily.   Yes Historical Provider, MD  naproxen (NAPROSYN) 375 MG tablet Take 1 tablet (375 mg total) by mouth 2 (two) times daily. 08/29/16  Yes Gilda Crease, MD  acyclovir  (ZOVIRAX) 400 MG tablet Take 2 tablets (800 mg total) by mouth 5 (five) times daily. 08/30/16 09/09/16  Arnaldo Natal, MD  sodium chloride (OCEAN NASAL SPRAY) 0.65 % nasal spray Place 1 spray into the nose as needed for congestion. Patient not taking: Reported on 08/29/2016 01/19/12 08/29/16  Johnsie Kindred, NP    Family History History reviewed. No pertinent family history.  Social History Social History  Substance Use Topics  . Smoking status: Never Smoker  . Smokeless tobacco: Never Used  . Alcohol use No     Allergies   Gluten meal   Review of Systems Review of Systems  Constitutional: Negative for chills and fever.  HENT: Negative for sore throat and tinnitus.   Eyes: Negative for redness.  Respiratory: Negative for cough and shortness of breath.   Cardiovascular: Negative for chest pain and palpitations.  Gastrointestinal: Negative for abdominal pain, diarrhea, nausea and vomiting.  Genitourinary: Negative for dysuria, frequency and urgency.  Musculoskeletal: Negative for myalgias.  Skin: Positive for rash.       No lesions  Neurological: Negative for weakness.  Hematological: Does not bruise/bleed easily.  Psychiatric/Behavioral: Negative for suicidal ideas.     Physical Exam Triage Vital Signs ED Triage Vitals  Enc Vitals Group     BP 08/30/16 1806 (!) 178/108     Pulse Rate  08/30/16 1806 92     Resp --      Temp 08/30/16 1806 97.7 F (36.5 C)     Temp Source 08/30/16 1806 Oral     SpO2 08/30/16 1806 98 %     Weight --      Height --      Head Circumference --      Peak Flow --      Pain Score 08/30/16 1805 7     Pain Loc --      Pain Edu? --      Excl. in GC? --    No data found.   Updated Vital Signs BP (!) 178/108 (BP Location: Right Arm)   Pulse 92   Temp 97.7 F (36.5 C) (Oral)   SpO2 98%   Visual Acuity Right Eye Distance:   Left Eye Distance:   Bilateral Distance:    Right Eye Near:   Left Eye Near:    Bilateral Near:      Physical Exam  Constitutional: She is oriented to person, place, and time. She appears well-developed and well-nourished. No distress.  HENT:  Head: Normocephalic and atraumatic.  Mouth/Throat: Oropharynx is clear and moist.  Eyes: Conjunctivae and EOM are normal. Pupils are equal, round, and reactive to light. No scleral icterus.  Neck: Normal range of motion. Neck supple. No JVD present. No tracheal deviation present. No thyromegaly present.  Cardiovascular: Normal rate, regular rhythm and normal heart sounds.  Exam reveals no gallop and no friction rub.   No murmur heard. Pulmonary/Chest: Effort normal and breath sounds normal.  Abdominal: Soft. Bowel sounds are normal. She exhibits no distension. There is no tenderness.  Musculoskeletal: Normal range of motion. She exhibits no edema.  Lymphadenopathy:    She has no cervical adenopathy.  Neurological: She is alert and oriented to person, place, and time. No cranial nerve deficit.  Skin: Skin is warm and dry. Rash noted.  Dermatome distribution; vesicular on red base  Psychiatric: She has a normal mood and affect. Her behavior is normal. Judgment and thought content normal.  Nursing note and vitals reviewed.    UC Treatments / Results  Labs (all labs ordered are listed, but only abnormal results are displayed) Labs Reviewed - No data to display  EKG  EKG Interpretation None       Radiology Ct Abdomen Pelvis W Contrast  Result Date: 08/29/2016 CLINICAL DATA:  Left flank pain, intermittent and worsening since yesterday. EXAM: CT ABDOMEN AND PELVIS WITH CONTRAST TECHNIQUE: Multidetector CT imaging of the abdomen and pelvis was performed using the standard protocol following bolus administration of intravenous contrast. CONTRAST:  ISOVUE-300 IOPAMIDOL (ISOVUE-300) INJECTION 61% COMPARISON:  None. FINDINGS: Lower chest: No acute abnormality. Hepatobiliary: No focal liver abnormality is seen. Status post cholecystectomy. No  biliary dilatation. Pancreas: Unremarkable. No pancreatic ductal dilatation or surrounding inflammatory changes. Spleen: Normal spleen size. 8 mm hypodensity in the inferior margin is indeterminate but more likely benign. Adrenals/Urinary Tract: Adrenal glands are unremarkable. Kidneys are normal, without renal calculi, focal lesion, or hydronephrosis. Bladder is unremarkable. Stomach/Bowel: Large hiatal hernia. Stomach and small bowel are otherwise unremarkable. Appendix is normal. Colon is remarkable only for uncomplicated mild diverticulosis. Vascular/Lymphatic: No significant vascular findings are present. No enlarged abdominal or pelvic lymph nodes. Reproductive: Uterus and bilateral adnexa are unremarkable. Other: No focal inflammation.  No ascites. Musculoskeletal: No significant skeletal lesion. IMPRESSION: 1. Large hiatal hernia. 2. Uncomplicated mild diverticulosis. 3. Subcentimeter focal hypodensity of the spleen,  more likely benign but not conclusively characterized. Electronically Signed   By: Ellery Plunkaniel R Mitchell M.D.   On: 08/29/2016 03:05    Procedures Procedures (including critical care time)  Medications Ordered in UC Medications - No data to display   Initial Impression / Assessment and Plan / UC Course  I have reviewed the triage vital signs and the nursing notes.  Pertinent labs & imaging results that were available during my care of the patient were reviewed by me and considered in my medical decision making (see chart for details).     Ddx includes shingles.  Sx c/w with the same.  No neuro involvement.  Final Clinical Impressions(s) / UC Diagnoses   Final diagnoses:  Herpes zoster with complication    New Prescriptions Discharge Medication List as of 08/30/2016  7:14 PM    START taking these medications   Details  acyclovir (ZOVIRAX) 400 MG tablet Take 2 tablets (800 mg total) by mouth 5 (five) times daily., Starting Sat 08/30/2016, Until Tue 09/09/2016, Normal           Arnaldo NatalMichael S Ellijah Leffel, MD 08/30/16 2005

## 2016-08-30 NOTE — ED Triage Notes (Signed)
Pt was seen in the ED yesterday for left groin pain with a CT done.  Pt developed blisters today along the same area that she was having pain in for the last week.  Pt is concerned for shingles.

## 2016-09-04 DIAGNOSIS — M818 Other osteoporosis without current pathological fracture: Secondary | ICD-10-CM | POA: Diagnosis not present

## 2016-09-04 DIAGNOSIS — Z1159 Encounter for screening for other viral diseases: Secondary | ICD-10-CM | POA: Diagnosis not present

## 2016-09-04 DIAGNOSIS — B029 Zoster without complications: Secondary | ICD-10-CM | POA: Diagnosis not present

## 2016-09-04 DIAGNOSIS — Z6824 Body mass index (BMI) 24.0-24.9, adult: Secondary | ICD-10-CM | POA: Diagnosis not present

## 2016-09-05 DIAGNOSIS — D225 Melanocytic nevi of trunk: Secondary | ICD-10-CM | POA: Diagnosis not present

## 2016-09-05 DIAGNOSIS — Z1283 Encounter for screening for malignant neoplasm of skin: Secondary | ICD-10-CM | POA: Diagnosis not present

## 2016-09-26 DIAGNOSIS — M810 Age-related osteoporosis without current pathological fracture: Secondary | ICD-10-CM | POA: Diagnosis not present

## 2016-11-04 DIAGNOSIS — Z6824 Body mass index (BMI) 24.0-24.9, adult: Secondary | ICD-10-CM | POA: Diagnosis not present

## 2016-11-04 DIAGNOSIS — J329 Chronic sinusitis, unspecified: Secondary | ICD-10-CM | POA: Diagnosis not present

## 2016-11-04 DIAGNOSIS — J40 Bronchitis, not specified as acute or chronic: Secondary | ICD-10-CM | POA: Diagnosis not present

## 2016-11-09 ENCOUNTER — Ambulatory Visit (HOSPITAL_COMMUNITY)
Admission: EM | Admit: 2016-11-09 | Discharge: 2016-11-09 | Disposition: A | Discharge status: Home / Self Care | Payer: BLUE CROSS/BLUE SHIELD | Attending: Internal Medicine | Admitting: Internal Medicine

## 2016-11-09 ENCOUNTER — Encounter (HOSPITAL_COMMUNITY): Payer: Self-pay | Admitting: Emergency Medicine

## 2016-11-09 DIAGNOSIS — K449 Diaphragmatic hernia without obstruction or gangrene: Secondary | ICD-10-CM

## 2016-11-09 DIAGNOSIS — A084 Viral intestinal infection, unspecified: Secondary | ICD-10-CM | POA: Diagnosis not present

## 2016-11-09 LAB — POCT URINALYSIS DIP (DEVICE)
GLUCOSE, UA: NEGATIVE mg/dL
Hgb urine dipstick: NEGATIVE
KETONES UR: NEGATIVE mg/dL
Nitrite: NEGATIVE
PROTEIN: 30 mg/dL — AB
SPECIFIC GRAVITY, URINE: 1.015 (ref 1.005–1.030)
UROBILINOGEN UA: 0.2 mg/dL (ref 0.0–1.0)
pH: 6 (ref 5.0–8.0)

## 2016-11-09 LAB — POCT I-STAT, CHEM 8
BUN: 12 mg/dL (ref 6–20)
CALCIUM ION: 1 mmol/L — AB (ref 1.15–1.40)
CREATININE: 0.6 mg/dL (ref 0.44–1.00)
Chloride: 100 mmol/L — ABNORMAL LOW (ref 101–111)
GLUCOSE: 103 mg/dL — AB (ref 65–99)
HCT: 43 % (ref 36.0–46.0)
HEMOGLOBIN: 14.6 g/dL (ref 12.0–15.0)
Potassium: 3.4 mmol/L — ABNORMAL LOW (ref 3.5–5.1)
Sodium: 138 mmol/L (ref 135–145)
TCO2: 28 mmol/L (ref 0–100)

## 2016-11-09 MED ORDER — ONDANSETRON 4 MG PO TBDP: 4.0000 mg | ORAL_TABLET | Freq: Three times a day (TID) | ORAL | 0 refills | Status: DC | PRN

## 2016-11-09 NOTE — Discharge Instructions (Signed)
Stay hydrated.  If symptoms worsen or not getting better over the next 24-48 hours you should go immediately to the emergency room for further evaluation and treatment.

## 2016-11-09 NOTE — ED Provider Notes (Signed)
CSN: [id number]     Arrival date & time 11/09/16  1212 History   First MD Initiated Contact with Patient 11/09/16 1402     Chief Complaint  Patient presents with  . Emesis   (Consider location/radiation/quality/duration/timing/severity/associated sxs/prior Treatment) HPI Patient comes in today with complaints of nausea vomiting and diarrhea 24 hours. States that about a week ago she was treated by her primary care physician for a sinus infection and started on Levaquin and prednisone. She was doing reasonably well up until the onset of GI symptoms yesterday. She did go to check file to get something to eat and symptoms started couple hours later.  Has had about 10 episodes of vomiting since yesterday along with dry heaves. Has been difficult to keep down any food or liquid. She also had multiple episodes of watery brown diarrhea with urgency. Some abdominal cramping with the vomiting but no specific pain. Denies melena, hematochezia. No recent travel. No dysuria, hematuria. Admits to having some chills last night. Continues to have a nasal congestion along with some drainage from sinus infection. She discontinued the prednisone due to this causing acid reflux whenever she takes this. She was seen in the emergency room 08/29/2016 with complaints of nausea vomiting and diarrhea and CT abdomen and pelvis with contrast was done. Report read large hiatal hernia, uncomplicated mild diverticulosis, subcentimeter focal hypodensity of the spleen more likely benign but cannot conclusively characterized,  states that she did not know she had a hiatal hernia. Admits to a long history of dysphagia with feeling of food getting stuck at the gastroesophageal junction. Past Medical History:  Diagnosis Date  . Asthma   . Hypertension    Past Surgical History:  Procedure Laterality Date  . CHOLECYSTECTOMY    . ELBOW SURGERY  2011  . MANDIBLE SURGERY     History reviewed. No pertinent family history. Social  History  Substance Use Topics  . Smoking status: Never Smoker  . Smokeless tobacco: Never Used  . Alcohol use No   OB History    No data available     Review of Systems  Constitutional: Positive for appetite change, chills, fatigue and fever.  HENT: Positive for congestion, postnasal drip and sinus pressure.   Respiratory: Positive for cough.   Cardiovascular: Negative.   Gastrointestinal: Positive for diarrhea, nausea and vomiting.  Genitourinary: Negative for dysuria and hematuria.  Psychiatric/Behavioral: Negative.     Allergies  Gluten meal  Home Medications   Prior to Admission medications   Medication Sig Start Date End Date Taking? Authorizing Provider  albuterol (PROVENTIL HFA;VENTOLIN HFA) 108 (90 BASE) MCG/ACT inhaler Inhale 2 puffs into the lungs every 6 (six) hours as needed. Shortness of breath   Yes [provider]  fluticasone (FLONASE) 50 MCG/ACT nasal spray Place 1 spray into both nostrils daily.   Yes [provider]  levofloxacin (LEVAQUIN) 750 MG tablet Take 750 mg by mouth daily.   Yes [provider]  loratadine (CLARITIN) 10 MG tablet Take 10 mg by mouth daily.   Yes [provider]  montelukast (SINGULAIR) 10 MG tablet Take 10 mg by mouth at bedtime.   Yes [provider]  predniSONE (DELTASONE) 5 MG tablet Take 5 mg by mouth daily with breakfast.   Yes [provider]  ondansetron (ZOFRAN ODT) 4 MG disintegrating tablet Take 1 tablet (4 mg total) by mouth every 8 (eight) hours as needed for nausea or vomiting. 11/09/16   Naida Sleightwens, Misheel Gowans M, PA-C  Meds Ordered and Administered this Visit  Medications - No data to display  BP 132/65 (BP Location: Right Arm)   Pulse 88   Temp 99.2 F (37.3 C) (Oral)   Resp 20   SpO2 100%  No data found.   Physical Exam  Constitutional: She is oriented to person, place, and time. She appears well-developed.  HENT:  Head: Normocephalic and atraumatic.  Eyes:  Pupils are equal, round, and reactive to light.  Neck: Normal range of motion.  Cardiovascular: Normal rate and regular rhythm.   Pulmonary/Chest: Effort normal and breath sounds normal. No respiratory distress.  Abdominal: Bowel sounds are normal. She exhibits no mass. There is tenderness (Epigastric). There is no guarding.  Neurological: She is alert and oriented to person, place, and time.  Skin: Skin is warm and dry.  Psychiatric: She has a normal mood and affect.    Urgent Care Course     Procedures (including critical care time)  Labs Review Labs Reviewed  POCT URINALYSIS DIP (DEVICE) - Abnormal; Notable for the following:       Result Value   Bilirubin Urine SMALL (*)    Protein, ur 30 (*)    Leukocytes, UA TRACE (*)    All other components within normal limits  POCT I-STAT, CHEM 8 - Abnormal; Notable for the following:    Potassium 3.4 (*)    Chloride 100 (*)    Glucose, Bld 103 (*)    Calcium, Ion 1.00 (*)    All other components within normal limits    Imaging Review No results found.   Visual Acuity Review  Right Eye Distance:   Left Eye Distance:   Bilateral Distance:    Right Eye Near:   Left Eye Near:    Bilateral Near:         MDM   1. Viral gastroenteritis   2. Large hiatal hernia     Patient given a prescription for Zofran 4 mg 1 tablet by mouth every 8 hours when necessary for nausea/vomiting. If symptoms worsen or not improve over the next 24-48 hours she will go immediately to the emergency room for further evaluation and treatment. Stay hydrated. I did advise patient to follow up with primary care physician for the large hiatal hernia that was seen on previous CT scan 08/29/2016. This does appear to be symptomatic. Anticipate that she will be needing GI referral and this was discussed. All questions answered.   Naida Sleight, PA-C 11/09/16 1457

## 2016-11-09 NOTE — ED Triage Notes (Signed)
[facility] with a complaint of N/V/D and general body aches x 2 days.

## 2017-02-06 DIAGNOSIS — M7061 Trochanteric bursitis, right hip: Secondary | ICD-10-CM | POA: Diagnosis not present

## 2017-02-06 DIAGNOSIS — M7062 Trochanteric bursitis, left hip: Secondary | ICD-10-CM | POA: Diagnosis not present

## 2017-02-26 DIAGNOSIS — N950 Postmenopausal bleeding: Secondary | ICD-10-CM | POA: Diagnosis not present

## 2017-02-26 DIAGNOSIS — Z1151 Encounter for screening for human papillomavirus (HPV): Secondary | ICD-10-CM | POA: Diagnosis not present

## 2017-02-26 DIAGNOSIS — Z124 Encounter for screening for malignant neoplasm of cervix: Secondary | ICD-10-CM | POA: Diagnosis not present

## 2017-02-27 DIAGNOSIS — J302 Other seasonal allergic rhinitis: Secondary | ICD-10-CM | POA: Diagnosis not present

## 2017-02-27 DIAGNOSIS — J4520 Mild intermittent asthma, uncomplicated: Secondary | ICD-10-CM | POA: Diagnosis not present

## 2017-02-27 DIAGNOSIS — J329 Chronic sinusitis, unspecified: Secondary | ICD-10-CM | POA: Diagnosis not present

## 2017-02-27 DIAGNOSIS — I10 Essential (primary) hypertension: Secondary | ICD-10-CM | POA: Diagnosis not present

## 2017-02-27 DIAGNOSIS — K449 Diaphragmatic hernia without obstruction or gangrene: Secondary | ICD-10-CM | POA: Diagnosis not present

## 2017-03-05 DIAGNOSIS — M7061 Trochanteric bursitis, right hip: Secondary | ICD-10-CM | POA: Diagnosis not present

## 2017-03-05 DIAGNOSIS — M7062 Trochanteric bursitis, left hip: Secondary | ICD-10-CM | POA: Diagnosis not present

## 2017-03-09 DIAGNOSIS — N950 Postmenopausal bleeding: Secondary | ICD-10-CM | POA: Diagnosis not present

## 2017-03-09 DIAGNOSIS — Z1231 Encounter for screening mammogram for malignant neoplasm of breast: Secondary | ICD-10-CM | POA: Diagnosis not present

## 2017-03-26 DIAGNOSIS — L820 Inflamed seborrheic keratosis: Secondary | ICD-10-CM | POA: Diagnosis not present

## 2017-03-26 DIAGNOSIS — Z1283 Encounter for screening for malignant neoplasm of skin: Secondary | ICD-10-CM | POA: Diagnosis not present

## 2017-03-26 DIAGNOSIS — H61001 Unspecified perichondritis of right external ear: Secondary | ICD-10-CM | POA: Diagnosis not present

## 2017-05-27 ENCOUNTER — Ambulatory Visit (HOSPITAL_COMMUNITY)
Admission: EM | Admit: 2017-05-27 | Discharge: 2017-05-27 | Disposition: A | Discharge status: Home / Self Care | Payer: BLUE CROSS/BLUE SHIELD | Attending: Family Medicine | Admitting: Family Medicine

## 2017-05-27 ENCOUNTER — Encounter (HOSPITAL_COMMUNITY): Payer: Self-pay | Admitting: Emergency Medicine

## 2017-05-27 ENCOUNTER — Ambulatory Visit (INDEPENDENT_AMBULATORY_CARE_PROVIDER_SITE_OTHER): Payer: BLUE CROSS/BLUE SHIELD

## 2017-05-27 DIAGNOSIS — W19XXXA Unspecified fall, initial encounter: Secondary | ICD-10-CM

## 2017-05-27 DIAGNOSIS — S62306A Unspecified fracture of fifth metacarpal bone, right hand, initial encounter for closed fracture: Secondary | ICD-10-CM

## 2017-05-27 DIAGNOSIS — S20212A Contusion of left front wall of thorax, initial encounter: Secondary | ICD-10-CM | POA: Diagnosis not present

## 2017-05-27 DIAGNOSIS — S6991XA Unspecified injury of right wrist, hand and finger(s), initial encounter: Secondary | ICD-10-CM

## 2017-05-27 DIAGNOSIS — R0782 Intercostal pain: Secondary | ICD-10-CM | POA: Diagnosis not present

## 2017-05-27 DIAGNOSIS — M79641 Pain in right hand: Secondary | ICD-10-CM | POA: Diagnosis not present

## 2017-05-27 DIAGNOSIS — R0781 Pleurodynia: Secondary | ICD-10-CM | POA: Diagnosis not present

## 2017-05-27 DIAGNOSIS — S62356A Nondisplaced fracture of shaft of fifth metacarpal bone, right hand, initial encounter for closed fracture: Secondary | ICD-10-CM | POA: Diagnosis not present

## 2017-05-27 NOTE — ED Triage Notes (Signed)
Pt had trip and fall c/o right hand pain and left sided rib pain

## 2017-05-27 NOTE — Progress Notes (Signed)
Orthopedic Tech Progress Note Patient Details:  [facility]yn[name] 09/12/1955 [id number]  Ortho Devices Type of Ortho Device: Ace wrap, Ulna gutter splint Ortho Device/Splint Location: RUE Ortho Device/Splint Interventions: Ordered, Application   Post Interventions Patient Tolerated: Well Instructions Provided: Care of device   Jennye MoccasinHughes, Kassey Laforest Craig 05/27/2017, 6:03 PM

## 2017-05-27 NOTE — Discharge Instructions (Addendum)
You may use over the counter ibuprofen or acetaminophen as needed.  ° °

## 2017-05-27 NOTE — ED Notes (Signed)
Spoke to ortho to apply splint

## 2017-05-28 NOTE — ED Provider Notes (Signed)
[facility]   [id number] 05/27/17 Arrival Time: 1510  ASSESSMENT & PLAN:  1. Hand injury, right, initial encounter   2. Rib contusion, left, initial encounter   3. Closed nondisplaced fracture of fifth metacarpal bone of right hand, unspecified portion of metacarpal, initial encounter    Ulnar gutter splint applied by orthopaedic tech. She will schedule orthopaedic f/u within on week. Prefers OTC analgesics for rib pain and hand pain. May f/u here as needed.  Reviewed expectations re: course of current medical issues. Questions answered. Outlined signs and symptoms indicating need for more acute intervention. Patient verbalized understanding. After Visit Summary given.   SUBJECTIVE:  Mckenzie Whitehead is a 61 y.o. female who presents with complaint of a fall today with injuries to her L ribs and R hand. Ribs sore, esp with inspiration. No SOB or trouble breathing. R hand with lateral swelling. Certain movements exacerbates. No OTC treatment. No extremity sensation changes or weakness.  ROS: As per HPI.   OBJECTIVE:  Vitals:   05/27/17 1544  BP: (!) 180/74  Pulse: 78  Resp: 18  Temp: 98.3 F (36.8 C)  TempSrc: Oral  SpO2: 100%    General appearance: alert; no distress Eyes: PERRLA; EOMI; conjunctiva normal HENT: normocephalic; atraumatic Neck: supple Lungs: clear to auscultation bilaterally Ribs: tender over L lower anterior ribs; no bruising Heart: regular rate and rhythm Abdomen: soft, non-tender Back: no CVA tenderness Extremities: R hand with swelling over 5th metacarpal; appears to have FROM of fingers with discomfort; sensation intact with normal capillary refill Skin: warm and dry Neurologic: normal gait; normal symmetric reflexes Psychological: alert and cooperative; normal mood and affect  Imaging: Dg Ribs Unilateral W/chest Left  Result Date: 05/27/2017 CLINICAL DATA:  61 year old female with a history of fall. Left rib pain EXAM: LEFT RIBS  AND CHEST - 3+ VIEW COMPARISON:  11/26/2011 FINDINGS: Cardiomediastinal silhouette within normal limits. No confluent airspace disease, pneumothorax, or pleural effusion. No acute displaced rib fracture. Fiducial markers on the left chest wall IMPRESSION: Negative for acute cardiopulmonary disease. No evidence of acute displaced rib fracture Electronically Signed   By: Gilmer MorJaime  Wagner D.O.   On: 05/27/2017 17:28   Dg Hand Complete Right  Result Date: 05/27/2017 CLINICAL DATA:  Fall, right hand pain to 5th digit EXAM: RIGHT HAND - COMPLETE 3+ VIEW COMPARISON:  None. FINDINGS: Nondisplaced oblique fracture of the distal 5th metacarpal. The joint spaces are preserved. Visualized soft tissues are within normal limits. IMPRESSION: Nondisplaced oblique fracture of the distal 5th metacarpal. Electronically Signed   By: Charline BillsSriyesh  Krishnan M.D.   On: 05/27/2017 17:19    Allergies  Allergen Reactions  . Gluten Meal     Past Medical History:  Diagnosis Date  . Asthma   . Hypertension    Social History   Socioeconomic History  . Marital status: Married    Spouse name: Not on file  . Number of children: Not on file  . Years of education: Not on file  . Highest education level: Not on file  Social Needs  . Financial resource strain: Not on file  . Food insecurity - worry: Not on file  . Food insecurity - inability: Not on file  . Transportation needs - medical: Not on file  . Transportation needs - non-medical: Not on file  Occupational History  . Not on file  Tobacco Use  . Smoking status: Never Smoker  . Smokeless tobacco: Never Used  Substance and Sexual Activity  . Alcohol use:  No  . Drug use: Yes  . Sexual activity: No  Other Topics Concern  . Not on file  Social History Narrative  . Not on file   History reviewed. No pertinent family history. Past Surgical History:  Procedure Laterality Date  . CHOLECYSTECTOMY    . ELBOW SURGERY  2011  . MANDIBLE SURGERY       Mardella LaymanHagler, Bayle Calvo,  MD 05/28/17 [phone]

## 2017-06-05 DIAGNOSIS — M79641 Pain in right hand: Secondary | ICD-10-CM | POA: Diagnosis not present

## 2017-06-05 DIAGNOSIS — S62306A Unspecified fracture of fifth metacarpal bone, right hand, initial encounter for closed fracture: Secondary | ICD-10-CM | POA: Diagnosis not present

## 2017-06-17 DIAGNOSIS — S62306D Unspecified fracture of fifth metacarpal bone, right hand, subsequent encounter for fracture with routine healing: Secondary | ICD-10-CM | POA: Diagnosis not present

## 2017-06-29 DIAGNOSIS — M79641 Pain in right hand: Secondary | ICD-10-CM | POA: Diagnosis not present

## 2017-06-29 DIAGNOSIS — M79644 Pain in right finger(s): Secondary | ICD-10-CM | POA: Diagnosis not present

## 2017-07-08 DIAGNOSIS — M79641 Pain in right hand: Secondary | ICD-10-CM | POA: Diagnosis not present

## 2017-07-20 DIAGNOSIS — S62366D Nondisplaced fracture of neck of fifth metacarpal bone, right hand, subsequent encounter for fracture with routine healing: Secondary | ICD-10-CM | POA: Diagnosis not present

## 2017-07-20 DIAGNOSIS — M79641 Pain in right hand: Secondary | ICD-10-CM | POA: Diagnosis not present

## 2017-07-28 DIAGNOSIS — H903 Sensorineural hearing loss, bilateral: Secondary | ICD-10-CM | POA: Diagnosis not present

## 2017-07-30 DIAGNOSIS — K449 Diaphragmatic hernia without obstruction or gangrene: Secondary | ICD-10-CM | POA: Diagnosis not present

## 2017-07-30 DIAGNOSIS — R1310 Dysphagia, unspecified: Secondary | ICD-10-CM | POA: Diagnosis not present

## 2017-08-21 DIAGNOSIS — H903 Sensorineural hearing loss, bilateral: Secondary | ICD-10-CM | POA: Diagnosis not present

## 2017-09-11 DIAGNOSIS — R1310 Dysphagia, unspecified: Secondary | ICD-10-CM | POA: Diagnosis not present

## 2017-09-11 DIAGNOSIS — Z1211 Encounter for screening for malignant neoplasm of colon: Secondary | ICD-10-CM | POA: Diagnosis not present

## 2017-09-11 DIAGNOSIS — K222 Esophageal obstruction: Secondary | ICD-10-CM | POA: Diagnosis not present

## 2017-09-11 DIAGNOSIS — D126 Benign neoplasm of colon, unspecified: Secondary | ICD-10-CM | POA: Diagnosis not present

## 2017-09-11 DIAGNOSIS — K449 Diaphragmatic hernia without obstruction or gangrene: Secondary | ICD-10-CM | POA: Diagnosis not present

## 2017-09-11 DIAGNOSIS — K640 First degree hemorrhoids: Secondary | ICD-10-CM | POA: Diagnosis not present

## 2017-09-11 DIAGNOSIS — K5730 Diverticulosis of large intestine without perforation or abscess without bleeding: Secondary | ICD-10-CM | POA: Diagnosis not present

## 2017-09-15 DIAGNOSIS — Z1211 Encounter for screening for malignant neoplasm of colon: Secondary | ICD-10-CM | POA: Diagnosis not present

## 2017-09-15 DIAGNOSIS — D126 Benign neoplasm of colon, unspecified: Secondary | ICD-10-CM | POA: Diagnosis not present

## 2018-05-19 DIAGNOSIS — J069 Acute upper respiratory infection, unspecified: Secondary | ICD-10-CM | POA: Diagnosis not present

## 2018-05-19 DIAGNOSIS — R197 Diarrhea, unspecified: Secondary | ICD-10-CM | POA: Diagnosis not present

## 2018-05-25 DIAGNOSIS — J329 Chronic sinusitis, unspecified: Secondary | ICD-10-CM | POA: Diagnosis not present

## 2018-05-25 DIAGNOSIS — J4520 Mild intermittent asthma, uncomplicated: Secondary | ICD-10-CM | POA: Diagnosis not present

## 2018-05-25 DIAGNOSIS — J40 Bronchitis, not specified as acute or chronic: Secondary | ICD-10-CM | POA: Diagnosis not present

## 2018-07-01 ENCOUNTER — Emergency Department (HOSPITAL_COMMUNITY)
Admission: EM | Admit: 2018-07-01 | Discharge: 2018-07-01 | Disposition: A | Discharge status: Home / Self Care | Payer: BLUE CROSS/BLUE SHIELD | Attending: Emergency Medicine | Admitting: Emergency Medicine

## 2018-07-01 ENCOUNTER — Other Ambulatory Visit: Payer: Self-pay

## 2018-07-01 ENCOUNTER — Emergency Department (HOSPITAL_COMMUNITY): Payer: BLUE CROSS/BLUE SHIELD

## 2018-07-01 ENCOUNTER — Encounter (HOSPITAL_COMMUNITY): Payer: Self-pay | Admitting: Emergency Medicine

## 2018-07-01 DIAGNOSIS — Z9049 Acquired absence of other specified parts of digestive tract: Secondary | ICD-10-CM | POA: Insufficient documentation

## 2018-07-01 DIAGNOSIS — Y92410 Unspecified street and highway as the place of occurrence of the external cause: Secondary | ICD-10-CM | POA: Insufficient documentation

## 2018-07-01 DIAGNOSIS — I10 Essential (primary) hypertension: Secondary | ICD-10-CM | POA: Diagnosis not present

## 2018-07-01 DIAGNOSIS — R0789 Other chest pain: Secondary | ICD-10-CM | POA: Diagnosis not present

## 2018-07-01 DIAGNOSIS — J45909 Unspecified asthma, uncomplicated: Secondary | ICD-10-CM | POA: Insufficient documentation

## 2018-07-01 DIAGNOSIS — Z79899 Other long term (current) drug therapy: Secondary | ICD-10-CM | POA: Insufficient documentation

## 2018-07-01 DIAGNOSIS — Y9389 Activity, other specified: Secondary | ICD-10-CM | POA: Insufficient documentation

## 2018-07-01 DIAGNOSIS — S20219A Contusion of unspecified front wall of thorax, initial encounter: Secondary | ICD-10-CM

## 2018-07-01 DIAGNOSIS — R079 Chest pain, unspecified: Secondary | ICD-10-CM | POA: Diagnosis not present

## 2018-07-01 DIAGNOSIS — S299XXA Unspecified injury of thorax, initial encounter: Secondary | ICD-10-CM | POA: Diagnosis not present

## 2018-07-01 DIAGNOSIS — Y998 Other external cause status: Secondary | ICD-10-CM | POA: Insufficient documentation

## 2018-07-01 MED ORDER — ACETAMINOPHEN 325 MG PO TABS
650.0000 mg | ORAL_TABLET | Freq: Once | ORAL | Status: AC
  Administered 2018-07-01: 650 mg via ORAL
  Filled 2018-07-01: qty 2

## 2018-07-01 MED ORDER — CYCLOBENZAPRINE HCL 10 MG PO TABS: 10.0000 mg | ORAL_TABLET | Freq: Two times a day (BID) | ORAL | 0 refills | Status: DC | PRN
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
## 2018-07-01 NOTE — ED Notes (Signed)
1750 on incentive spirometer

## 2018-07-13 DIAGNOSIS — Z09 Encounter for follow-up examination after completed treatment for conditions other than malignant neoplasm: Secondary | ICD-10-CM | POA: Diagnosis not present

## 2018-07-13 DIAGNOSIS — S20219D Contusion of unspecified front wall of thorax, subsequent encounter: Secondary | ICD-10-CM | POA: Diagnosis not present

## 2018-08-17 DIAGNOSIS — Z0000 Encounter for general adult medical examination without abnormal findings: Secondary | ICD-10-CM | POA: Diagnosis not present

## 2018-08-25 DIAGNOSIS — M810 Age-related osteoporosis without current pathological fracture: Secondary | ICD-10-CM | POA: Diagnosis not present

## 2019-02-07 DIAGNOSIS — Z1231 Encounter for screening mammogram for malignant neoplasm of breast: Secondary | ICD-10-CM | POA: Diagnosis not present

## 2019-02-10 DIAGNOSIS — L820 Inflamed seborrheic keratosis: Secondary | ICD-10-CM | POA: Diagnosis not present

## 2019-02-10 DIAGNOSIS — Z1283 Encounter for screening for malignant neoplasm of skin: Secondary | ICD-10-CM | POA: Diagnosis not present

## 2019-02-10 DIAGNOSIS — D225 Melanocytic nevi of trunk: Secondary | ICD-10-CM | POA: Diagnosis not present

## 2019-04-08 DIAGNOSIS — Z23 Encounter for immunization: Secondary | ICD-10-CM | POA: Diagnosis not present

## 2019-05-08 IMAGING — DX DG HAND COMPLETE 3+V*R*
3 series · 3 of 3 positions shown · non-contrast
Comparison: None.

CLINICAL DATA: Fall, right hand pain to 5th digit

EXAM:
RIGHT HAND - COMPLETE 3+ VIEW

[hand pa]
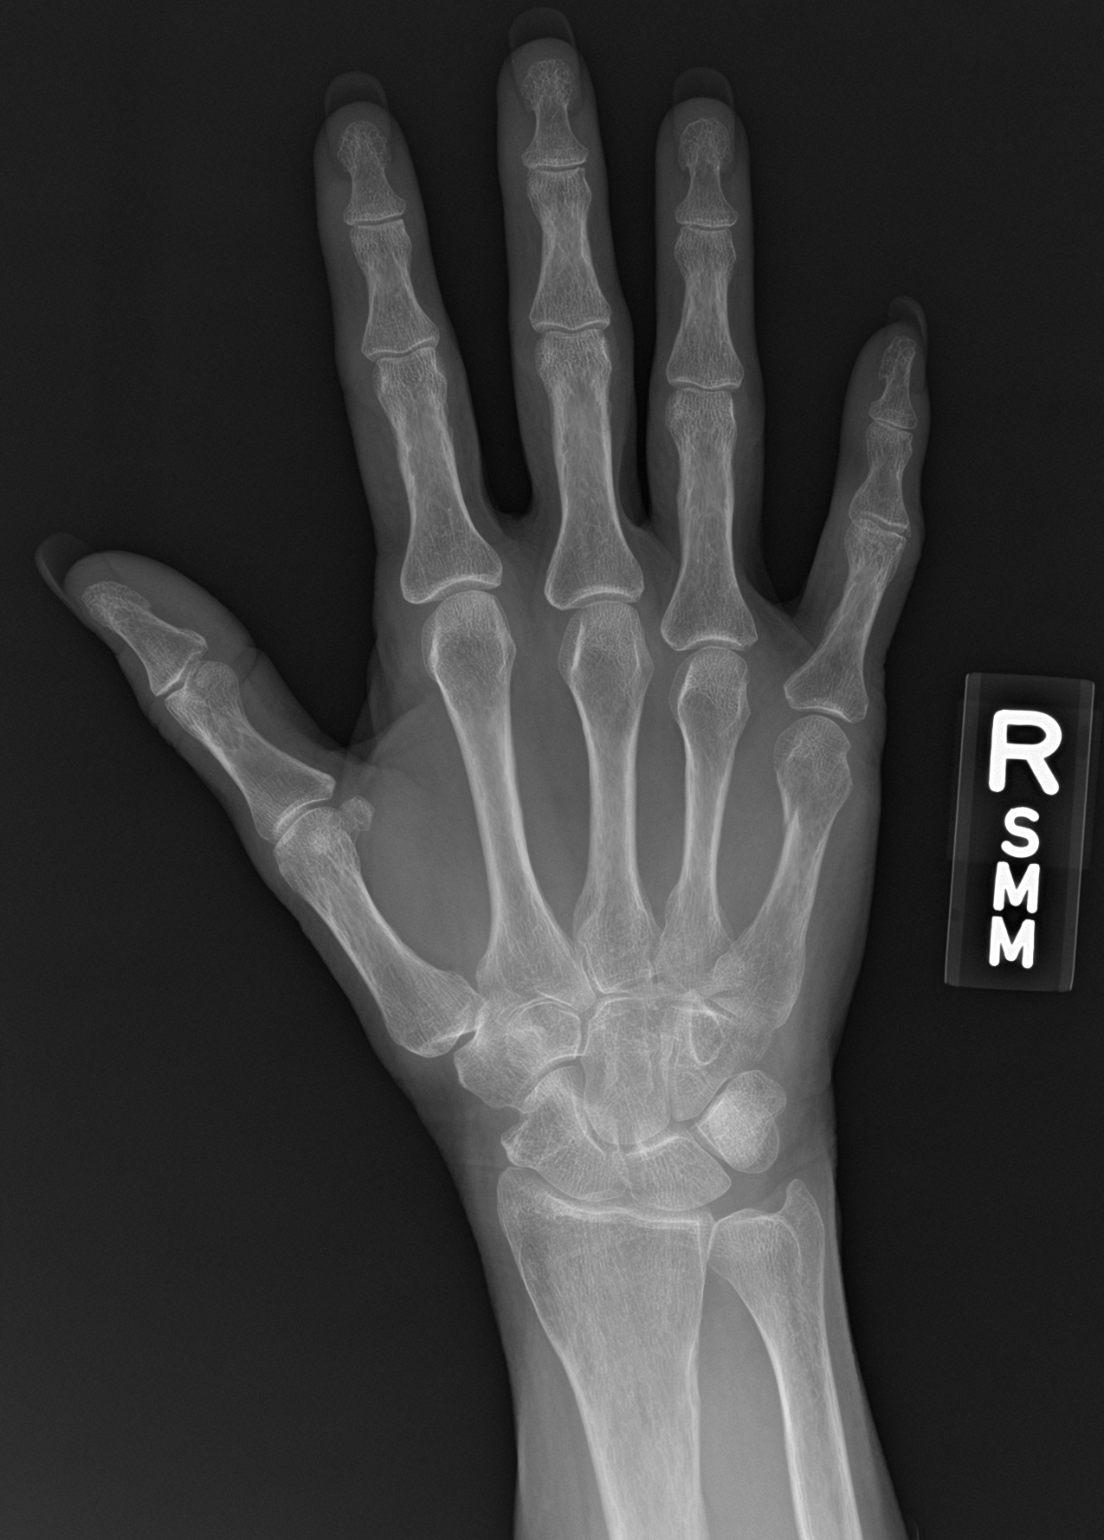

[hand obl]
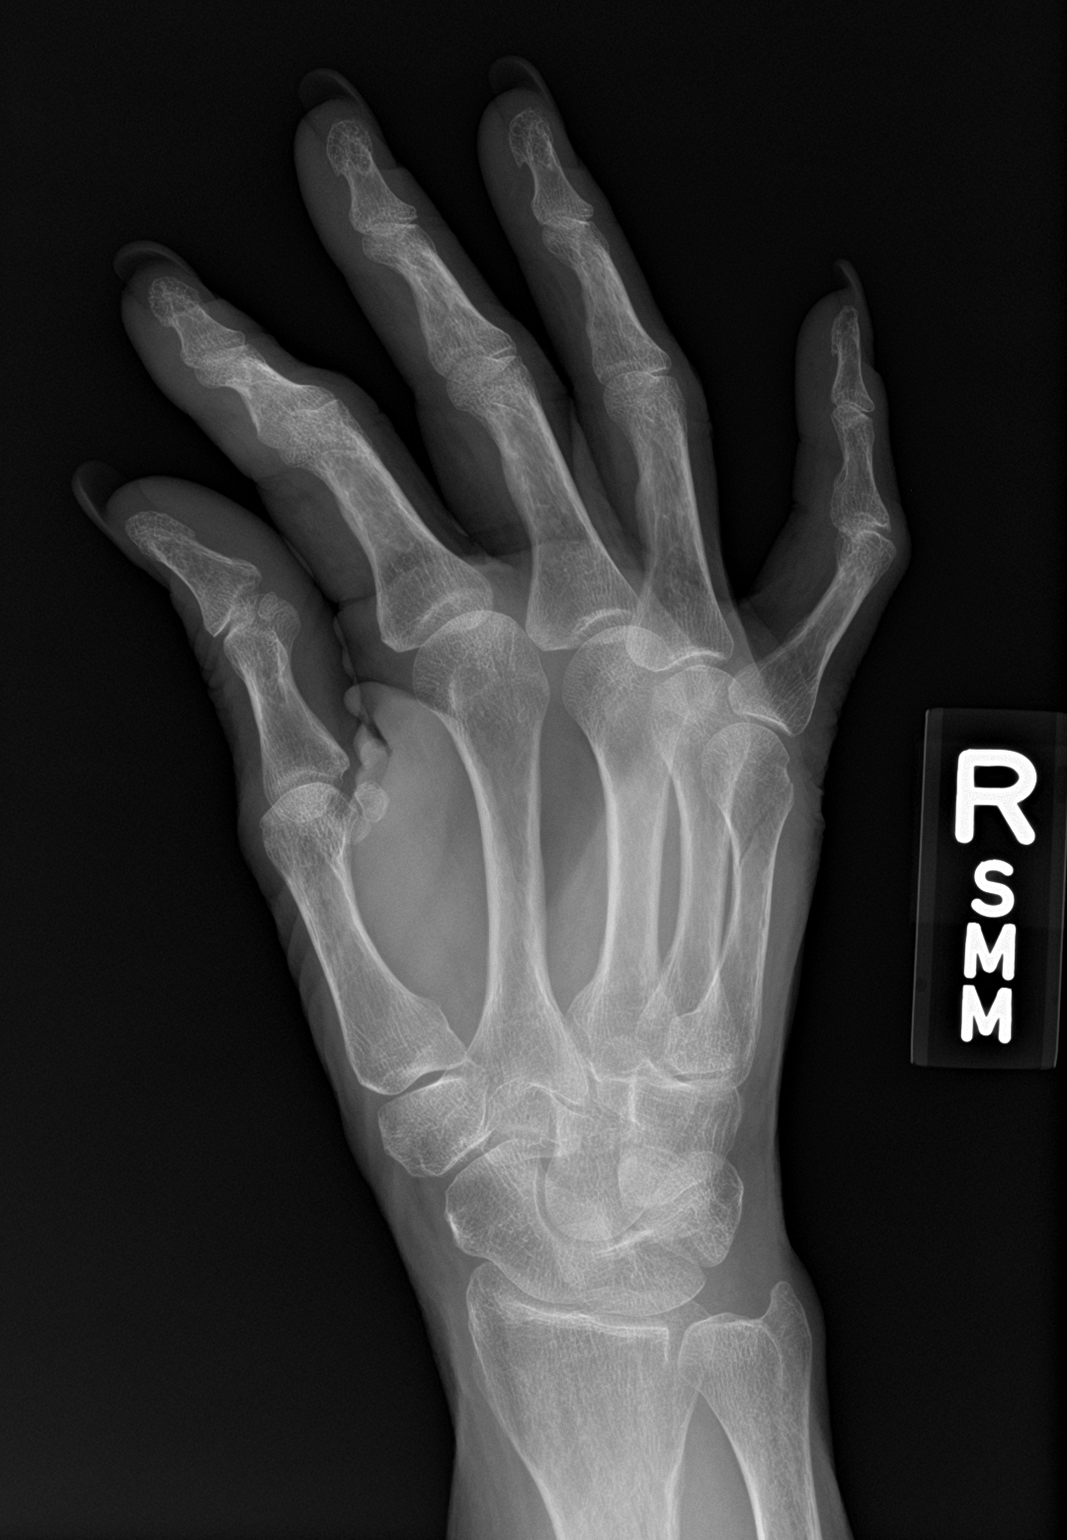

[hand lat]
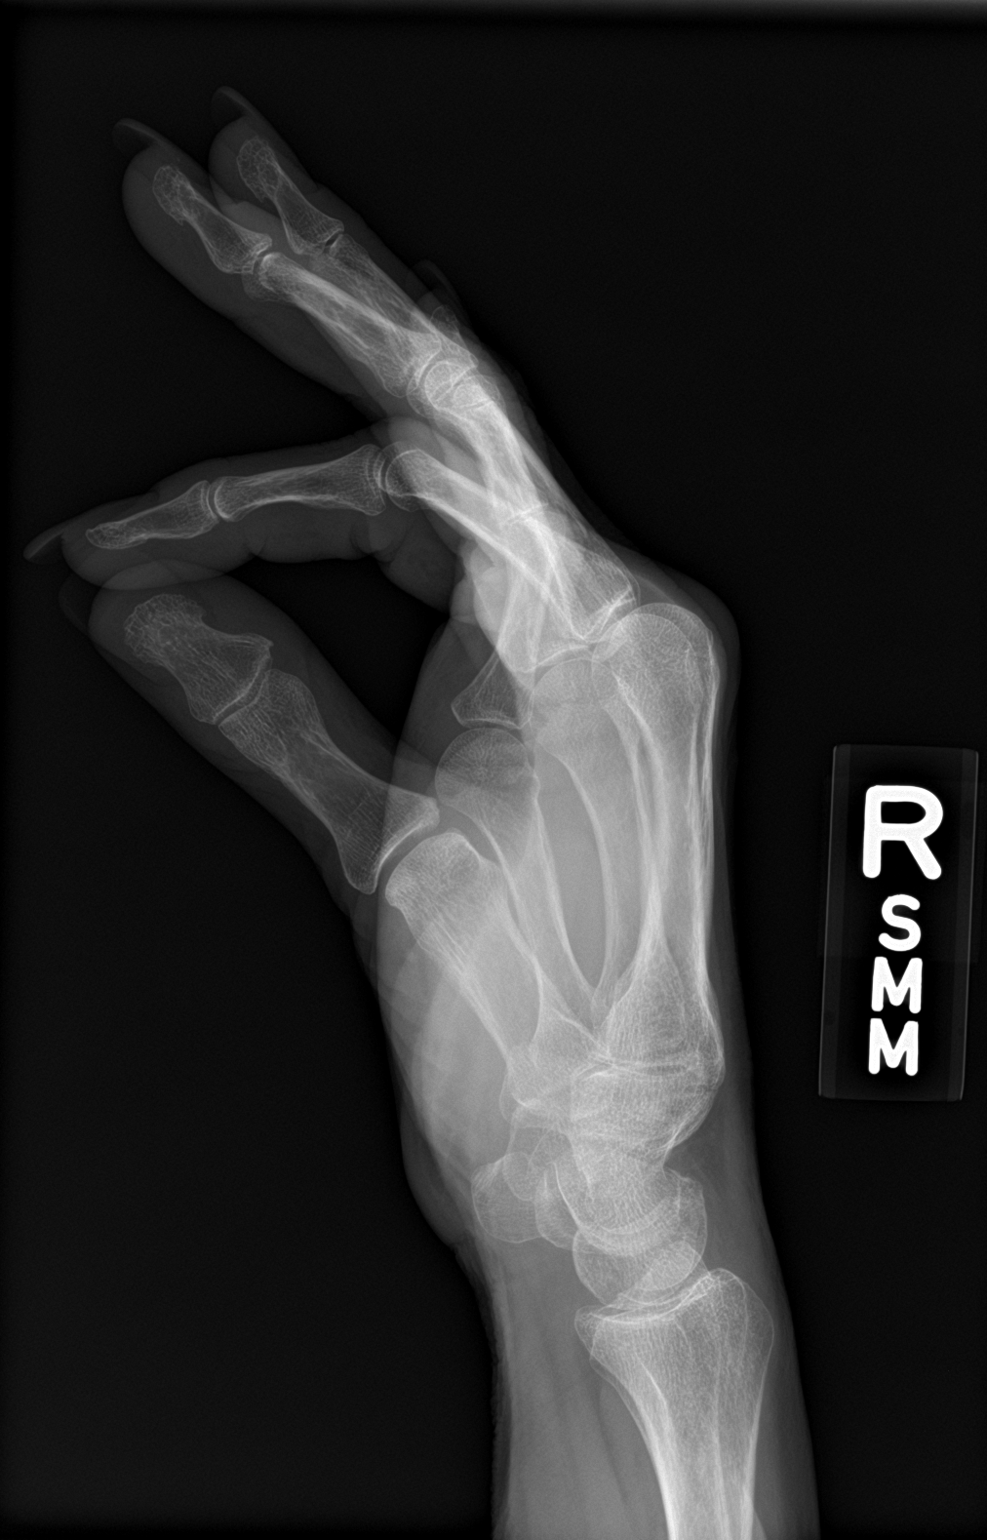

[3 of 3 positions shown; findings below may reference images not displayed]

FINDINGS: Nondisplaced oblique fracture of the distal 5th metacarpal.

The joint spaces are preserved.

Visualized soft tissues are within normal limits.
IMPRESSION: Nondisplaced oblique fracture of the distal 5th metacarpal.

## 2019-05-08 IMAGING — DX DG RIBS W/ CHEST 3+V*L*
3 series · 3 of 3 positions shown · non-contrast
Comparison: 11/26/2011

CLINICAL DATA: 61-year-old female with a history of fall. Left rib
pain

EXAM:
LEFT RIBS AND CHEST - 3+ VIEW

[chest pa]
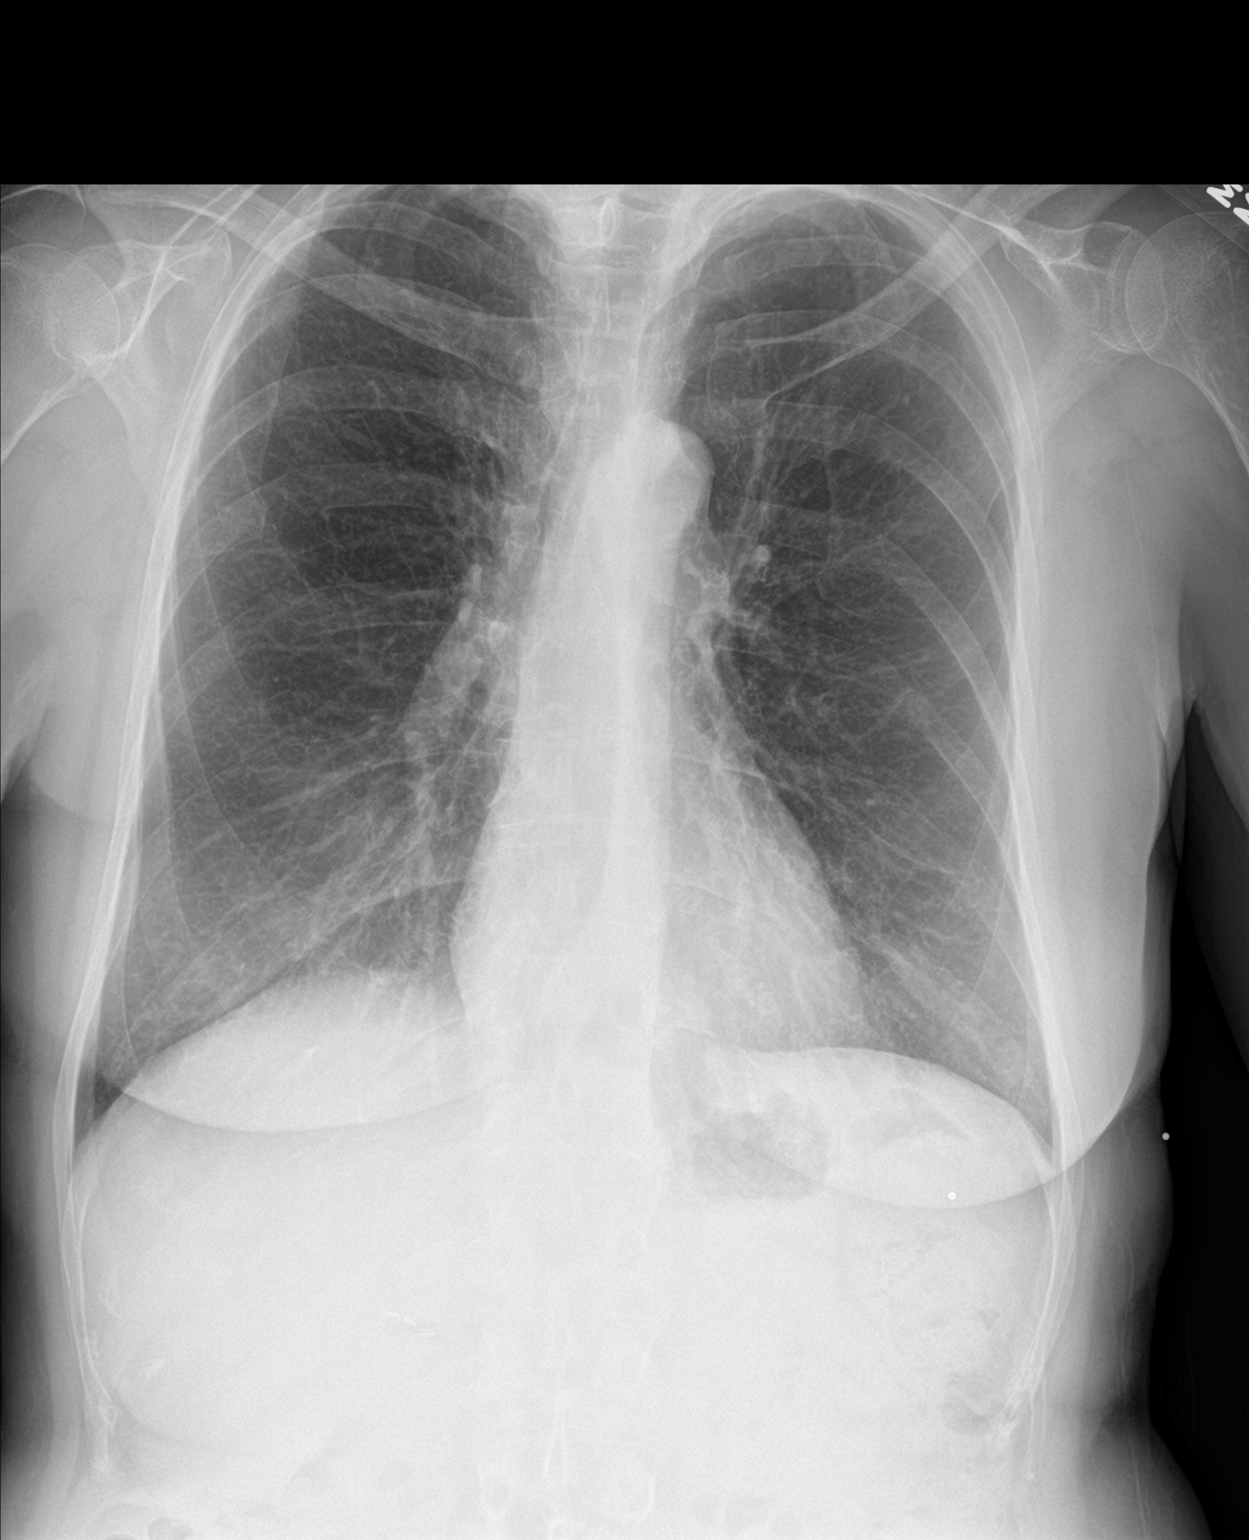

[rib obl]
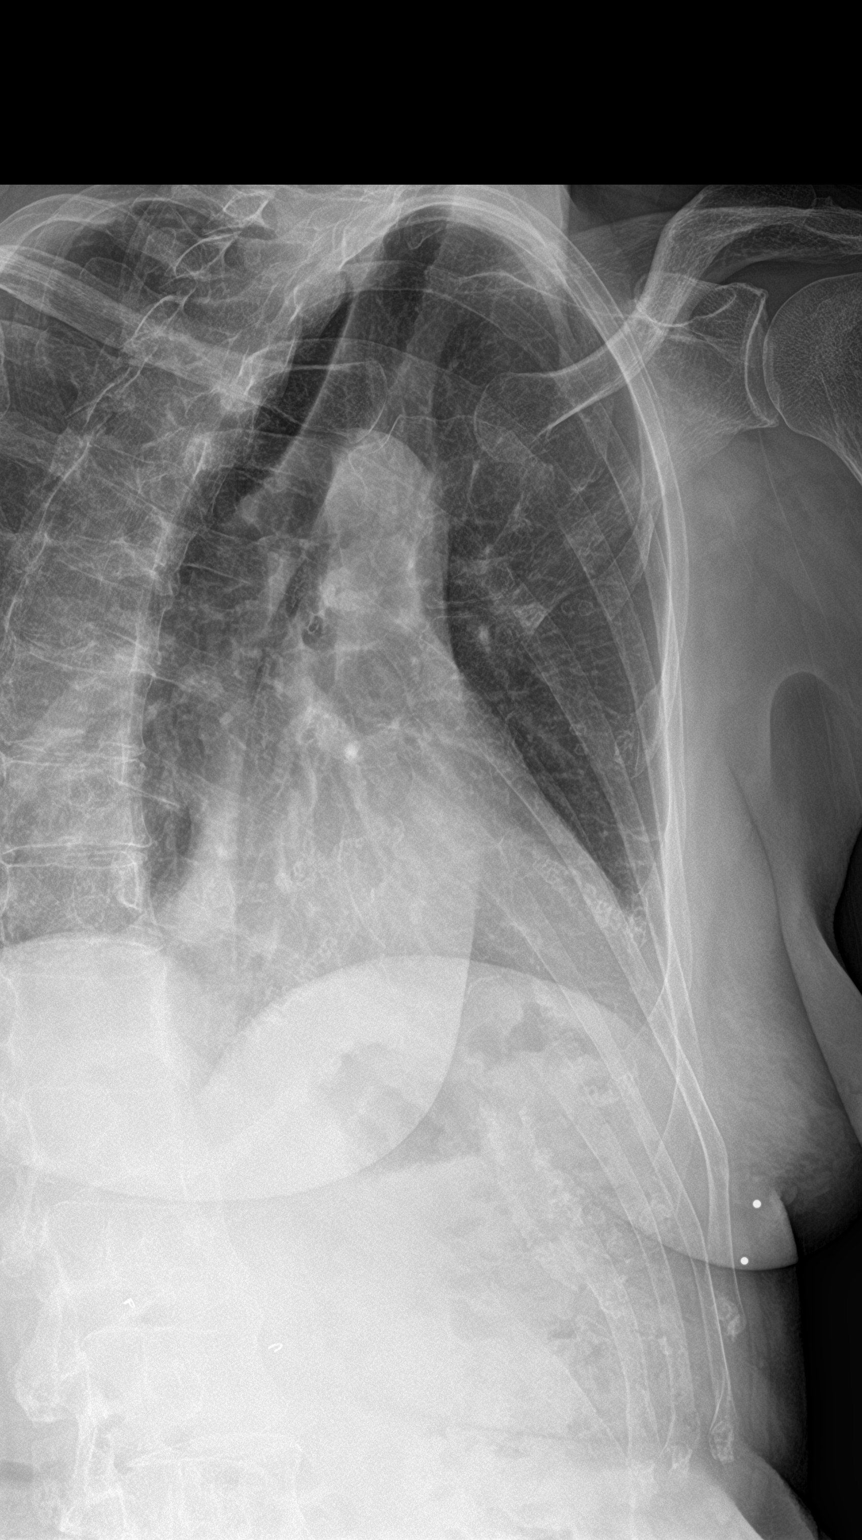

[rib pa]
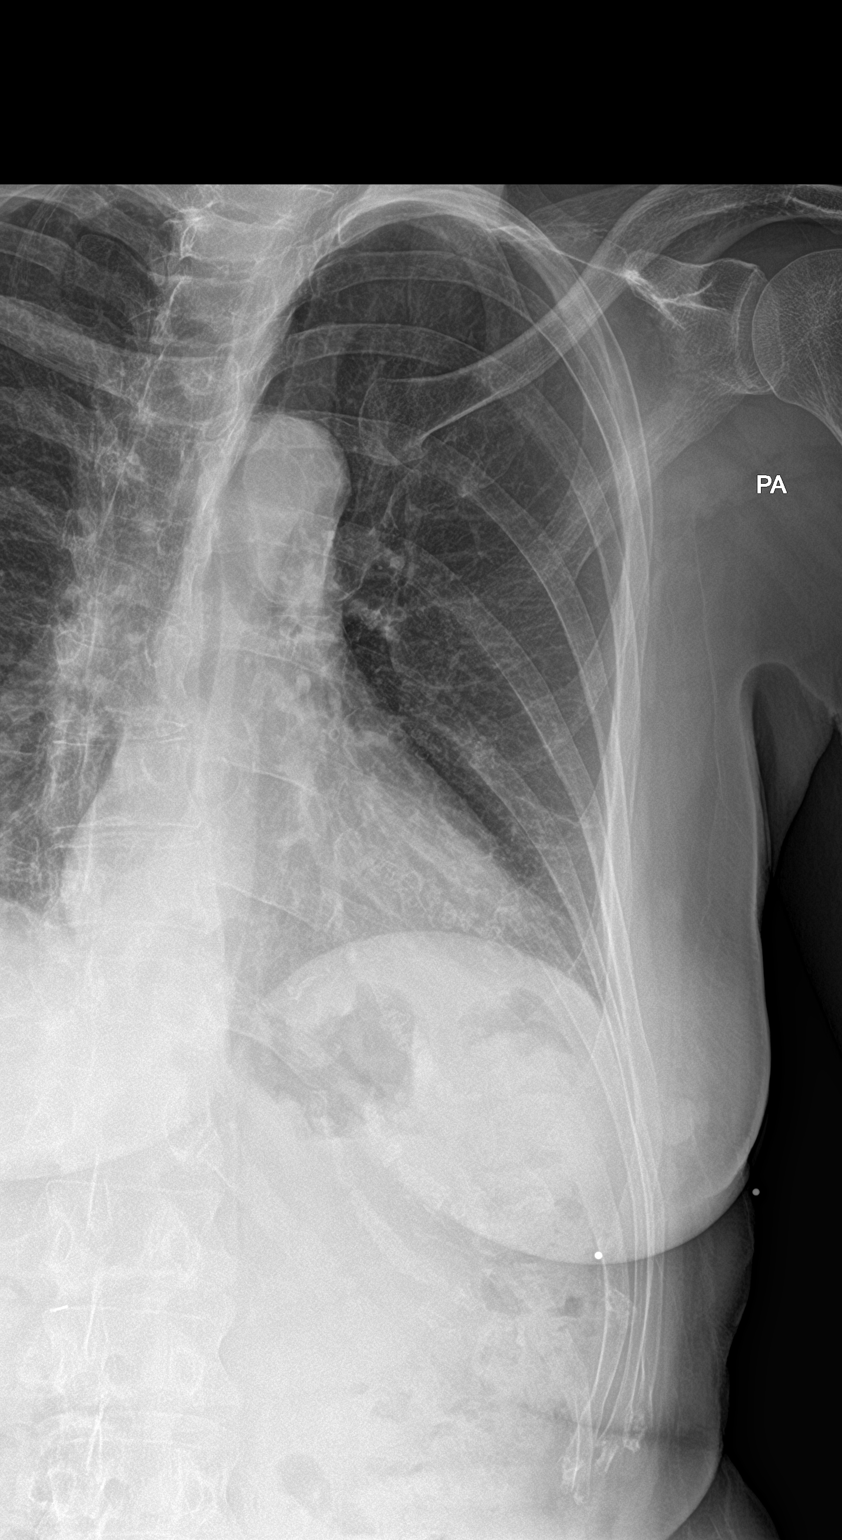

[3 of 3 positions shown; findings below may reference images not displayed]

FINDINGS: Cardiomediastinal silhouette within normal limits.

No confluent airspace disease, pneumothorax, or pleural effusion.

No acute displaced rib fracture.

Fiducial markers on the left chest wall
IMPRESSION: Negative for acute cardiopulmonary disease.

No evidence of acute displaced rib fracture

## 2019-09-22 ENCOUNTER — Ambulatory Visit: Payer: Self-pay | Attending: Internal Medicine

## 2019-09-22 DIAGNOSIS — Z23 Encounter for immunization: Secondary | ICD-10-CM

## 2019-09-22 NOTE — Progress Notes (Signed)
   Covid-19 Vaccination Clinic  Name:  LUARA FAYE    MRN: [id number] DOB: September 19, 1955  09/22/2019  Ms. Raboin was observed post Covid-19 immunization for 15 minutes without incident. She was provided with Vaccine Information Sheet and instruction to access the V-Safe system.   Ms. Chatmon was instructed to call 911 with any severe reactions post vaccine: Marland Kitchen Difficulty breathing  . Swelling of face and throat  . A fast heartbeat  . A bad rash all over body  . Dizziness and weakness   Immunizations Administered    Name Date Dose VIS Date Route   Pfizer COVID-19 Vaccine 09/22/2019 10:51 AM 0.3 mL 06/03/2019 Intramuscular   Manufacturer: [employer], [employer]   Lot: [id number]   NDC: [address]-8

## 2019-10-19 ENCOUNTER — Ambulatory Visit: Payer: Medicare Other | Attending: Internal Medicine

## 2019-10-19 DIAGNOSIS — Z23 Encounter for immunization: Secondary | ICD-10-CM

## 2019-10-19 NOTE — Progress Notes (Signed)
   Covid-19 Vaccination Clinic  Name:  Mckenzie Whitehead    MRN: [id number] DOB: 27-Nov-1955  10/19/2019  [name] was observed post Covid-19 immunization for 15 minutes without incident. She was provided with Vaccine Information Sheet and instruction to access the V-Safe system.   [name] was instructed to call 911 with any severe reactions post vaccine: Marland Kitchen Difficulty breathing  . Swelling of face and throat  . A fast heartbeat  . A bad rash all over body  . Dizziness and weakness   Immunizations Administered    Name Date Dose VIS Date Route   Pfizer COVID-19 Vaccine 10/19/2019  9:02 AM 0.3 mL 08/17/2018 Intramuscular   Manufacturer: [employer], [employer]   Lot: [id number]   NDC: [address]-2

## 2020-05-26 ENCOUNTER — Emergency Department (HOSPITAL_COMMUNITY): Payer: Medicare Other

## 2020-05-26 ENCOUNTER — Observation Stay (HOSPITAL_COMMUNITY)
Admission: EM | Admit: 2020-05-26 | Discharge: 2020-05-27 | Disposition: A | Discharge status: Home / Self Care | Payer: Medicare Other | Attending: Internal Medicine | Admitting: Internal Medicine

## 2020-05-26 ENCOUNTER — Encounter (HOSPITAL_COMMUNITY): Payer: Self-pay | Admitting: Emergency Medicine

## 2020-05-26 ENCOUNTER — Other Ambulatory Visit: Payer: Self-pay

## 2020-05-26 DIAGNOSIS — G459 Transient cerebral ischemic attack, unspecified: Principal | ICD-10-CM | POA: Insufficient documentation

## 2020-05-26 DIAGNOSIS — R55 Syncope and collapse: Secondary | ICD-10-CM | POA: Diagnosis present

## 2020-05-26 DIAGNOSIS — J45909 Unspecified asthma, uncomplicated: Secondary | ICD-10-CM | POA: Insufficient documentation

## 2020-05-26 DIAGNOSIS — Z20822 Contact with and (suspected) exposure to covid-19: Secondary | ICD-10-CM | POA: Diagnosis not present

## 2020-05-26 DIAGNOSIS — R200 Anesthesia of skin: Secondary | ICD-10-CM | POA: Diagnosis present

## 2020-05-26 DIAGNOSIS — I10 Essential (primary) hypertension: Secondary | ICD-10-CM | POA: Insufficient documentation

## 2020-05-26 DIAGNOSIS — M549 Dorsalgia, unspecified: Secondary | ICD-10-CM | POA: Diagnosis present

## 2020-05-26 LAB — CBC
HCT: 37.7 % (ref 36.0–46.0)
Hemoglobin: 12 g/dL (ref 12.0–15.0)
MCH: 30.7 pg (ref 26.0–34.0)
MCHC: 31.8 g/dL (ref 30.0–36.0)
MCV: 96.4 fL (ref 80.0–100.0)
Platelets: 269 10*3/uL (ref 150–400)
RBC: 3.91 MIL/uL (ref 3.87–5.11)
RDW: 12.4 % (ref 11.5–15.5)
WBC: 6.3 10*3/uL (ref 4.0–10.5)
nRBC: 0 % (ref 0.0–0.2)

## 2020-05-26 LAB — DIFFERENTIAL
Abs Immature Granulocytes: 0.03 10*3/uL (ref 0.00–0.07)
Basophils Absolute: 0.1 10*3/uL (ref 0.0–0.1)
Basophils Relative: 1 %
Eosinophils Absolute: 0.1 10*3/uL (ref 0.0–0.5)
Eosinophils Relative: 2 %
Immature Granulocytes: 1 %
Lymphocytes Relative: 28 %
Lymphs Abs: 1.7 10*3/uL (ref 0.7–4.0)
Monocytes Absolute: 0.6 10*3/uL (ref 0.1–1.0)
Monocytes Relative: 9 %
Neutro Abs: 3.7 10*3/uL (ref 1.7–7.7)
Neutrophils Relative %: 59 %

## 2020-05-26 LAB — TROPONIN I (HIGH SENSITIVITY)
Troponin I (High Sensitivity): <2 ng/L (ref <18)
Troponin I (High Sensitivity): 3 ng/L (ref <18)

## 2020-05-26 LAB — COMPREHENSIVE METABOLIC PANEL
ALT: 16 U/L (ref 0–44)
AST: 19 U/L (ref 15–41)
Albumin: 3.9 g/dL (ref 3.5–5.0)
Alkaline Phosphatase: 48 U/L (ref 38–126)
Anion gap: 10 (ref 5–15)
BUN: 24 mg/dL — ABNORMAL HIGH (ref 8–23)
CO2: 23 mmol/L (ref 22–32)
Calcium: 9.5 mg/dL (ref 8.9–10.3)
Chloride: 103 mmol/L (ref 98–111)
Creatinine, Ser: 0.68 mg/dL (ref 0.44–1.00)
GFR, Estimated: >60 mL/min (ref >60)
Glucose, Bld: 115 mg/dL — ABNORMAL HIGH (ref 70–99)
Potassium: 4.1 mmol/L (ref 3.5–5.1)
Sodium: 136 mmol/L (ref 135–145)
Total Bilirubin: 0.8 mg/dL (ref 0.3–1.2)
Total Protein: 6.7 g/dL (ref 6.5–8.1)

## 2020-05-26 LAB — RESP PANEL BY RT-PCR (FLU A&B, COVID) ARPGX2
Influenza A by PCR: NEGATIVE
Influenza B by PCR: NEGATIVE
SARS Coronavirus 2 by RT PCR: NEGATIVE

## 2020-05-26 LAB — APTT: aPTT: 24 seconds (ref 24–36)

## 2020-05-26 LAB — PROTIME-INR
INR: 1 (ref 0.8–1.2)
Prothrombin Time: 13 seconds (ref 11.4–15.2)

## 2020-05-26 MED ORDER — PANTOPRAZOLE SODIUM 40 MG PO TBEC: 40.0000 mg | DELAYED_RELEASE_TABLET | Freq: Every day | ORAL | Status: DC

## 2020-05-26 MED ORDER — TIZANIDINE HCL 4 MG PO TABS: 4.0000 mg | ORAL_TABLET | Freq: Every day | ORAL | Status: DC

## 2020-05-26 MED ORDER — MONTELUKAST SODIUM 10 MG PO TABS
10.0000 mg | ORAL_TABLET | Freq: Every day | ORAL | Status: DC
  Filled 2020-05-26: qty 1

## 2020-05-26 MED ORDER — ONDANSETRON HCL 4 MG PO TABS: 4.0000 mg | ORAL_TABLET | Freq: Four times a day (QID) | ORAL | Status: DC | PRN

## 2020-05-26 MED ORDER — ONDANSETRON HCL 4 MG/2ML IJ SOLN: 4.0000 mg | Freq: Four times a day (QID) | INTRAMUSCULAR | Status: DC | PRN

## 2020-05-26 MED ORDER — ACETAMINOPHEN 325 MG PO TABS: 650.0000 mg | ORAL_TABLET | Freq: Four times a day (QID) | ORAL | Status: DC | PRN

## 2020-05-26 MED ORDER — SODIUM CHLORIDE 0.9% FLUSH
3.0000 mL | Freq: Once | INTRAVENOUS | Status: AC
  Administered 2020-05-26: 3 mL via INTRAVENOUS

## 2020-05-26 MED ORDER — BISACODYL 5 MG PO TBEC: 5.0000 mg | DELAYED_RELEASE_TABLET | Freq: Every day | ORAL | Status: DC | PRN

## 2020-05-26 MED ORDER — SODIUM CHLORIDE 0.9% FLUSH
3.0000 mL | Freq: Two times a day (BID) | INTRAVENOUS | Status: DC
  Administered 2020-05-27: 3 mL via INTRAVENOUS

## 2020-05-26 MED ORDER — SODIUM CHLORIDE 0.9 % IV SOLN: INTRAVENOUS | Status: AC

## 2020-05-26 MED ORDER — IOHEXOL 350 MG/ML SOLN
100.0000 mL | Freq: Once | INTRAVENOUS | Status: AC | PRN
  Administered 2020-05-26: 100 mL via INTRAVENOUS

## 2020-05-26 MED ORDER — SENNOSIDES-DOCUSATE SODIUM 8.6-50 MG PO TABS: 1.0000 | ORAL_TABLET | Freq: Every evening | ORAL | Status: DC | PRN

## 2020-05-26 MED ORDER — ENOXAPARIN SODIUM 40 MG/0.4ML ~~LOC~~ SOLN
40.0000 mg | SUBCUTANEOUS | Status: DC
  Administered 2020-05-27: 40 mg via SUBCUTANEOUS
  Filled 2020-05-26: qty 0.4

## 2020-05-26 MED ORDER — HYDROCODONE-ACETAMINOPHEN 5-325 MG PO TABS: 1.0000 | ORAL_TABLET | ORAL | Status: DC | PRN

## 2020-05-26 MED ORDER — ALBUTEROL SULFATE HFA 108 (90 BASE) MCG/ACT IN AERS: 2.0000 | INHALATION_SPRAY | Freq: Four times a day (QID) | RESPIRATORY_TRACT | Status: DC | PRN

## 2020-05-26 MED ORDER — IRBESARTAN 150 MG PO TABS
150.0000 mg | ORAL_TABLET | Freq: Every day | ORAL | Status: DC
  Filled 2020-05-26: qty 1

## 2020-05-26 MED ORDER — ACETAMINOPHEN 650 MG RE SUPP: 650.0000 mg | Freq: Four times a day (QID) | RECTAL | Status: DC | PRN

## 2020-05-26 NOTE — ED Triage Notes (Signed)
Pt states she was at [facility] and had sudden onset of R leg numbness that lasted approx 15 min.  States she was sweating and had labored breathing.  Now reports mid back pain and neck pain.  No arm drift.

## 2020-05-26 NOTE — ED Notes (Signed)
Patient transported to CT 

## 2020-05-26 NOTE — H&P (Signed)
History and Physical    Mckenzie Whitehead:[id number] DOB: 01/14/1956 DOA: 05/26/2020  PCP: Hadley Pen, MD   Patient coming from: Home   Chief Complaint: Right leg numbness, near syncope   HPI: Mckenzie Whitehead is a 64 y.o. female with medical history significant for asthma, hypertension, back pain, and bilateral leg cramps at night, now presenting to emergency department following an episode of near syncope and right leg numbness.  The patient reports that she had been in her usual state of health, had an uneventful day, had not eaten anything since this morning, and had been standing while watching a Christmas parade with her husband when she developed acute onset of lightheadedness, cold sweat, felt as though she was about to pass out, and also experienced right leg numbness with this.  She reached for her husband who helped her and kept her from falling.  She began to feel better quickly and symptoms completely resolved within approximately 15 minutes.  She had never experienced this previously.  There was no associated chest pain or palpitations.  She had some mild ache in her upper back associated with this.  She reports a history of back pain involving upper and lower back but denies any history of radiculopathy or syncope or near syncope.  She denies any recent fevers or chills.  Her husband notes that the patient's watch measures blood pressure and he saw that the diastolic pressure was 60 but does not recall what the top number was.  ED Course: Upon arrival to the ED, patient is found to be afebrile, saturating well on room air, and with stable blood pressure.  EKG features normal sinus rhythm.  Noncontrast head CT is negative for acute intracranial abnormality.  CT chest/abdomen/pelvis is negative for acute findings.  Chemistry panel is notable for an elevated BUN to creatinine ratio.  CBC is unremarkable.  High-sensitivity troponin is normal x2.  COVID-19 screening test not yet  resulted.  Neurology was consulted by the ED physician and hospitalists asked to admit.  Review of Systems:  All other systems reviewed and apart from HPI, are negative.  Past Medical History:  Diagnosis Date  . Asthma   . Hypertension     Past Surgical History:  Procedure Laterality Date  . CHOLECYSTECTOMY    . ELBOW SURGERY  2011  . MANDIBLE SURGERY      Social History:   reports that she has never smoked. She has never used smokeless tobacco. She reports current drug use. She reports that she does not drink alcohol.  Allergies  Allergen Reactions  . Gluten Meal Nausea And Vomiting    History reviewed. No pertinent family history.   Prior to Admission medications   Medication Sig Start Date End Date Taking? Authorizing Provider  albuterol (PROVENTIL HFA;VENTOLIN HFA) 108 (90 BASE) MCG/ACT inhaler Inhale 2 puffs into the lungs every 6 (six) hours as needed for wheezing or shortness of breath.    Yes [provider]  Calcium-Phosphorus-Vitamin D (CALCIUM/D3 ADULT GUMMIES PO) Take 2 each by mouth at bedtime.   Yes [provider]  Coenzyme Q10 (COQ10) 100 MG CAPS Take 1 tablet by mouth daily.   Yes [provider]  esomeprazole (NEXIUM) 20 MG capsule Take 20 mg by mouth daily.    Yes [provider]  fexofenadine (ALLEGRA) 180 MG tablet Take 180 mg by mouth daily.   Yes [provider]  fluticasone (FLONASE) 50 MCG/ACT nasal spray Place 1 spray into both nostrils  daily as needed for allergies.    Yes [provider]  LYSINE PO Take 1 tablet by mouth at bedtime.   Yes [provider]  montelukast (SINGULAIR) 10 MG tablet Take 10 mg by mouth daily.    Yes [provider]  Multiple Vitamins-Minerals (MULTIVITAMIN WOMEN 50+) TABS Take 1 tablet by mouth daily.   Yes [provider]  telmisartan (MICARDIS) 40 MG tablet Take 40 mg by mouth daily. 05/12/20  Yes [provider]  tiZANidine  (ZANAFLEX) 4 MG capsule Take 4 mg by mouth at bedtime.  02/15/20  Yes [provider]  TURMERIC PO Take 2 capsules by mouth daily.    Yes [provider]    Physical Exam: Vitals:   05/26/20 1532 05/26/20 1600 05/26/20 1630 05/26/20 1913  BP: 130/76 138/72 139/74 134/68  Pulse: 81 73 73 74  Resp: 18 (!) 22 (!) 21 16  Temp:      TempSrc:      SpO2: 97% 100% 100% 98%  Weight:      Height:        Constitutional: NAD, calm  Eyes: PERTLA, lids and conjunctivae normal ENMT: Mucous membranes are moist. Posterior pharynx clear of any exudate or lesions.   Neck: normal, supple, no masses, no thyromegaly Respiratory:  no wheezing, no crackles. No accessory muscle use.  Cardiovascular: S1 & S2 heard, regular rate and rhythm. No extremity edema.   Abdomen: No distension, no tenderness, soft. Bowel sounds active.  Musculoskeletal: no clubbing / cyanosis. No joint deformity upper and lower extremities.   Skin: no significant rashes, lesions, ulcers. Warm, dry, well-perfused. Neurologic: CN 2-12 grossly intact. Sensation intact, DTR normal. Strength 5/5 in all 4 limbs.  Psychiatric: Alert and oriented to person, place, and situation. Pleasant and cooperative.    Labs and Imaging on Admission: I have personally reviewed following labs and imaging studies  CBC: Recent Labs  Lab 05/26/20 1414  WBC 6.3  NEUTROABS 3.7  HGB 12.0  HCT 37.7  MCV 96.4  PLT 269   Basic Metabolic Panel: Recent Labs  Lab 05/26/20 1414  NA 136  K 4.1  CL 103  CO2 23  GLUCOSE 115*  BUN 24*  CREATININE 0.68  CALCIUM 9.5   GFR: Estimated Creatinine Clearance: 80.8 mL/min (by C-G formula based on SCr of 0.68 mg/dL). Liver Function Tests: Recent Labs  Lab 05/26/20 1414  AST 19  ALT 16  ALKPHOS 48  BILITOT 0.8  PROT 6.7  ALBUMIN 3.9   No results for input(s): LIPASE, AMYLASE in the last 168 hours. No results for input(s): AMMONIA in the last 168 hours. Coagulation  Profile: Recent Labs  Lab 05/26/20 1414  INR 1.0   Cardiac Enzymes: No results for input(s): CKTOTAL, CKMB, CKMBINDEX, TROPONINI in the last 168 hours. BNP (last 3 results) No results for input(s): PROBNP in the last 8760 hours. HbA1C: No results for input(s): HGBA1C in the last 72 hours. CBG: No results for input(s): GLUCAP in the last 168 hours. Lipid Profile: No results for input(s): CHOL, HDL, LDLCALC, TRIG, CHOLHDL, LDLDIRECT in the last 72 hours. Thyroid Function Tests: No results for input(s): TSH, T4TOTAL, FREET4, T3FREE, THYROIDAB in the last 72 hours. Anemia Panel: No results for input(s): VITAMINB12, FOLATE, FERRITIN, TIBC, IRON, RETICCTPCT in the last 72 hours. Urine analysis:    Component Value Date/Time   COLORURINE STRAW (A) 08/28/2016 2333   APPEARANCEUR HAZY (A) 08/28/2016 2333   LABSPEC 1.015 11/09/2016 1420   PHURINE  6.0 11/09/2016 1420   GLUCOSEU NEGATIVE 11/09/2016 1420   HGBUR NEGATIVE 11/09/2016 1420   BILIRUBINUR SMALL (A) 11/09/2016 1420   KETONESUR NEGATIVE 11/09/2016 1420   PROTEINUR 30 (A) 11/09/2016 1420   UROBILINOGEN 0.2 11/09/2016 1420   NITRITE NEGATIVE 11/09/2016 1420   LEUKOCYTESUR TRACE (A) 11/09/2016 1420   Sepsis Labs: @LABRCNTIP (procalcitonin:4,lacticidven:4) )No results found for this or any previous visit (from the past 240 hour(s)).   Radiological Exams on Admission: CT HEAD WO CONTRAST  Result Date: 05/26/2020 CLINICAL DATA:  Neuro deficit.  Right leg weakness. EXAM: CT HEAD WITHOUT CONTRAST TECHNIQUE: Contiguous axial images were obtained from the base of the skull through the vertex without intravenous contrast. COMPARISON:  None. FINDINGS: Brain: No evidence of acute infarction, hemorrhage, hydrocephalus, extra-axial collection or mass lesion/mass effect. Vascular: No hyperdense vessel or unexpected calcification. Skull: Normal. Negative for fracture or focal lesion. Sinuses/Orbits: Polyp or retention cyst in the anterior right  maxillary sinus. Focal mucosal disease or polyp type structure in the right ethmoid air cells. Other: None IMPRESSION: No acute intracranial abnormality. Electronically Signed   By: 14/09/2019 M.D.   On: 05/26/2020 14:43   CT Angio Chest/Abd/Pel for Dissection W and/or W/WO  Result Date: 05/26/2020 CLINICAL DATA:  Abdominal pain. Pt states she was at 14/09/2019 and had sudden onset of R leg numbness that lasted approx 15 min. States she was sweating and had labored breathing. Now reports mid back pain and neck pain. EXAM: CT ANGIOGRAPHY CHEST, ABDOMEN AND PELVIS TECHNIQUE: Non-contrast CT of the chest was initially obtained. Multidetector CT imaging through the chest, abdomen and pelvis was performed using the standard protocol during bolus administration of intravenous contrast. Multiplanar reconstructed images and MIPs were obtained and reviewed to evaluate the vascular anatomy. CONTRAST:  [employer] OMNIPAQUE IOHEXOL 350 MG/ML SOLN COMPARISON:  08/29/2016 FINDINGS: CTA CHEST FINDINGS Cardiovascular: Well opacified aorta. Thoracic aorta is normal in caliber. No dissection. No atherosclerosis. Pulmonary arteries also well opacified. No evidence of a pulmonary embolus. Heart normal in size and configuration. No pericardial effusion. No coronary artery calcifications. Aortic arch branch vessels are widely patent. Mediastinum/Nodes: Moderate hiatal hernia. Normal thyroid. No neck base, mediastinal or hilar masses or enlarged lymph nodes. Trachea and esophagus are unremarkable. Lungs/Pleura: Minor linear opacities in the right middle lobe and left upper lobe lingula consistent with atelectasis or scarring. Lungs otherwise clear. No pleural effusion or pneumothorax. Musculoskeletal: Old fracture of the sternal body. No acute fractures. No bone lesions. Mild degenerative changes of the thoracic spine. Review of the MIP images confirms the above findings. CTA ABDOMEN AND PELVIS FINDINGS VASCULAR Aorta: Normal caliber  aorta without aneurysm, dissection, vasculitis or significant stenosis. Celiac: Patent without evidence of aneurysm, dissection, vasculitis or significant stenosis. SMA: Patent without evidence of aneurysm, dissection, vasculitis or significant stenosis. Renals: Both renal arteries are patent without evidence of aneurysm, dissection, vasculitis, fibromuscular dysplasia or significant stenosis. IMA: Patent without evidence of aneurysm, dissection, vasculitis or significant stenosis. Inflow: Patent without evidence of aneurysm, dissection, vasculitis or significant stenosis. Veins: No obvious venous abnormality within the limitations of this arterial phase study. Review of the MIP images confirms the above findings. NON-VASCULAR Hepatobiliary: No focal liver abnormality is seen. Status post cholecystectomy. No biliary dilatation. Pancreas: Unremarkable. No pancreatic ductal dilatation or surrounding inflammatory changes. Spleen: Normal in size without focal abnormality. Adrenals/Urinary Tract: Adrenal glands are unremarkable. Kidneys are normal, without renal calculi, focal lesion, or hydronephrosis. Bladder is unremarkable. Stomach/Bowel: Moderate hiatal hernia. Stomach otherwise unremarkable.  Normal small bowel. Scattered colonic diverticula. Mild increase in colonic stool burden. No colon wall thickening or adjacent inflammation. Normal appendix visualized. Lymphatic: No enlarged lymph nodes. Reproductive: Uterus and bilateral adnexa are unremarkable. Other: No abdominal wall hernia or abnormality. No abdominopelvic ascites. Musculoskeletal: No fracture or acute finding. No bone lesion. Disc degenerative changes at L4-L5. Review of the MIP images confirms the above findings. IMPRESSION: CTA FINDINGS 1. Normal. No aortic dissection. No aortic aneurysm and no atherosclerosis. Aortic branch vessels are all widely patent. NON CTA FINDINGS 1. No acute findings within the chest, abdomen or pelvis. 2. Moderate hiatal  hernia. 3. Mild generalized increase in colonic stool burden. Scattered colonic diverticula. Electronically Signed   By: Amie Portlandavid  Ormond M.D.   On: 05/26/2020 17:27    EKG: Independently reviewed. Normal sinus rhythm.   Assessment/Plan   1. Near-syncope  - Patient presents following an episode of near syncope with transient right leg numbness  - This occurred in setting of prolonged standing and fasting, there was no associated chest pain or palpitations, EKG and cardiac exam are normal - Plan to check orthostatic vitals, continue cardiac monitoring overnight, hydrate with IVF overnight, check echo if abnormalities on cardiac monitoring or if TIA workup recommended by neuro consultant  2. Transient right leg numbness  - Patient presents following an episode of near syncope with right leg numbness lasting ~15 minutes  - Patient feels back to baseline in ED  - She has hx of back pain but denies radiculopathy or hx of similar sxs  - Head CT, EKG, and neuro exam normal  - Neurology consulted by ED physician, will follow-up on recommendations    3. Hypertension  - BP at goal, continue ARB    4. Asthma  - No SOB, wheezing, or cough on admission  - Continue Singulair and as-needed albuterol    DVT prophylaxis: Lovenox  Code Status: Full  Family Communication: Husband updated at bedside Disposition Plan: Patient is from: Home  Anticipated d/c is to: Home  Anticipated d/c date is: 05/27/20 Patient currently: Pending cardiac monitoring, neurology consultation  Consults called: neurology consulted by ED physician  Admission status: Observation     Briscoe Deutscherimothy S Keifer Habib, MD [facility]  05/26/2020, 8:36 PM

## 2020-05-26 NOTE — ED Provider Notes (Signed)
[facility] EMERGENCY DEPARTMENT Provider Note   CSN: [id number] Arrival date & time: 05/26/20  1332     History Chief Complaint  Patient presents with  . Numbness  . Near Syncope    Mckenzie Whitehead is a 64 y.o. female.  Patient complaining of right leg numbness and weakness with mid back pain. Patient states that she was at a Christmas parade standing watching the parade when she felt sudden onset of right leg numbness and weakness. She had to grab onto her husband so she would not fall down. She felt lightheaded and dizzy. She also complaining of having a sensation of dull moderate pain in between her shoulder blades on her upper back. She states her symptoms of her right leg numbness and tingling have since resolved and he feels fine now, however she has persistent upper back pain with a sensation of shortness of breath. Denies loss of consciousness or fall. Denies recent illnesses such as fever vomiting cough or diarrhea.        Past Medical History:  Diagnosis Date  . Asthma   . Hypertension     There are no problems to display for this patient.   Past Surgical History:  Procedure Laterality Date  . CHOLECYSTECTOMY    . ELBOW SURGERY  2011  . MANDIBLE SURGERY       OB History   No obstetric history on file.     No family history on file.  Social History   Tobacco Use  . Smoking status: Never Smoker  . Smokeless tobacco: Never Used  Substance Use Topics  . Alcohol use: No  . Drug use: Yes    Home Medications Prior to Admission medications   Medication Sig Start Date End Date Taking? Authorizing Provider  albuterol (PROVENTIL HFA;VENTOLIN HFA) 108 (90 BASE) MCG/ACT inhaler Inhale 2 puffs into the lungs every 6 (six) hours as needed for wheezing or shortness of breath.    Yes [provider]  Calcium-Phosphorus-Vitamin D (CALCIUM/D3 ADULT GUMMIES PO) Take 2 each by mouth at bedtime.   Yes [provider]  Coenzyme  Q10 (COQ10) 100 MG CAPS Take 1 tablet by mouth daily.   Yes [provider]  esomeprazole (NEXIUM) 20 MG capsule Take 20 mg by mouth daily.    Yes [provider]  fexofenadine (ALLEGRA) 180 MG tablet Take 180 mg by mouth daily.   Yes [provider]  fluticasone (FLONASE) 50 MCG/ACT nasal spray Place 1 spray into both nostrils daily as needed for allergies.    Yes [provider]  LYSINE PO Take 1 tablet by mouth at bedtime.   Yes [provider]  montelukast (SINGULAIR) 10 MG tablet Take 10 mg by mouth daily.    Yes [provider]  Multiple Vitamins-Minerals (MULTIVITAMIN WOMEN 50+) TABS Take 1 tablet by mouth daily.   Yes [provider]  telmisartan (MICARDIS) 40 MG tablet Take 40 mg by mouth daily. 05/12/20  Yes [provider]  tiZANidine (ZANAFLEX) 4 MG capsule Take 4 mg by mouth at bedtime.  02/15/20  Yes [provider]  TURMERIC PO Take 2 capsules by mouth daily.    Yes [provider]  cyclobenzaprine (FLEXERIL) 10 MG tablet Take 1 tablet (10 mg total) by mouth 2 (two) times daily as needed for muscle spasms. Patient not taking: Reported on 05/26/2020 07/01/18   Virgina Norfolk, DO  ondansetron (ZOFRAN ODT) 4 MG disintegrating tablet Take 1 tablet (4 mg total)  by mouth every 8 (eight) hours as needed for nausea or vomiting. Patient not taking: Reported on 07/01/2018 11/09/16   Naida Sleight, PA-C    Allergies    Gluten meal  Review of Systems   Review of Systems  Constitutional: Negative for fever.  HENT: Negative for ear pain.   Eyes: Negative for pain.  Respiratory: Negative for cough.   Cardiovascular: Negative for chest pain.  Gastrointestinal: Negative for abdominal pain.  Genitourinary: Negative for flank pain.  Musculoskeletal: Positive for back pain.  Skin: Negative for rash.  Neurological: Negative for headaches.    Physical Exam Updated Vital Signs BP 139/74   Pulse 73   Temp  (!) 97.5 F (36.4 C) (Oral)   Resp (!) 21   Ht 5\' 8"  (1.727 m)   Wt 86.6 kg   SpO2 100%   BMI 29.04 kg/m   Physical Exam Constitutional:      General: She is not in acute distress.    Appearance: Normal appearance.  HENT:     Head: Normocephalic.     Nose: Nose normal.  Eyes:     Extraocular Movements: Extraocular movements intact.  Cardiovascular:     Rate and Rhythm: Normal rate.  Pulmonary:     Effort: Pulmonary effort is normal.  Abdominal:     Palpations: Abdomen is soft.     Tenderness: There is no abdominal tenderness.  Musculoskeletal:        General: Normal range of motion.     Cervical back: Normal range of motion.     Right lower leg: No edema.     Left lower leg: No edema.  Skin:    General: Skin is warm.  Neurological:     General: No focal deficit present.     Mental Status: She is alert.     Comments: Bilateral lower extremities within normal limits. Cap refill is less than 3 seconds, compartment soft, dorsalis pedis pulses are 2+ bilaterally and posterior tibial pulses are 2+ bilaterally. Strength is 5/5 all extremities. Cranial nerves II to XII intact as well.     ED Results / Procedures / Treatments   Labs (all labs ordered are listed, but only abnormal results are displayed) Labs Reviewed  COMPREHENSIVE METABOLIC PANEL - Abnormal; Notable for the following components:      Result Value   Glucose, Bld 115 (*)    BUN 24 (*)    All other components within normal limits  RESP PANEL BY RT-PCR (FLU A&B, COVID) ARPGX2  PROTIME-INR  APTT  CBC  DIFFERENTIAL  TROPONIN I (HIGH SENSITIVITY)  TROPONIN I (HIGH SENSITIVITY)    EKG None  Radiology CT HEAD WO CONTRAST  Result Date: 05/26/2020 CLINICAL DATA:  Neuro deficit.  Right leg weakness. EXAM: CT HEAD WITHOUT CONTRAST TECHNIQUE: Contiguous axial images were obtained from the base of the skull through the vertex without intravenous contrast. COMPARISON:  None. FINDINGS: Brain: No evidence of acute  infarction, hemorrhage, hydrocephalus, extra-axial collection or mass lesion/mass effect. Vascular: No hyperdense vessel or unexpected calcification. Skull: Normal. Negative for fracture or focal lesion. Sinuses/Orbits: Polyp or retention cyst in the anterior right maxillary sinus. Focal mucosal disease or polyp type structure in the right ethmoid air cells. Other: None IMPRESSION: No acute intracranial abnormality. Electronically Signed   By: 14/09/2019 M.D.   On: 05/26/2020 14:43   CT Angio Chest/Abd/Pel for Dissection W and/or W/WO  Result Date: 05/26/2020 CLINICAL DATA:  Abdominal pain. Pt states she was at  Christmas parade and had sudden onset of R leg numbness that lasted approx 15 min. States she was sweating and had labored breathing. Now reports mid back pain and neck pain. EXAM: CT ANGIOGRAPHY CHEST, ABDOMEN AND PELVIS TECHNIQUE: Non-contrast CT of the chest was initially obtained. Multidetector CT imaging through the chest, abdomen and pelvis was performed using the standard protocol during bolus administration of intravenous contrast. Multiplanar reconstructed images and MIPs were obtained and reviewed to evaluate the vascular anatomy. CONTRAST:  100mL OMNIPAQUE IOHEXOL 350 MG/ML SOLN COMPARISON:  08/29/2016 FINDINGS: CTA CHEST FINDINGS Cardiovascular: Well opacified aorta. Thoracic aorta is normal in caliber. No dissection. No atherosclerosis. Pulmonary arteries also well opacified. No evidence of a pulmonary embolus. Heart normal in size and configuration. No pericardial effusion. No coronary artery calcifications. Aortic arch branch vessels are widely patent. Mediastinum/Nodes: Moderate hiatal hernia. Normal thyroid. No neck base, mediastinal or hilar masses or enlarged lymph nodes. Trachea and esophagus are unremarkable. Lungs/Pleura: Minor linear opacities in the right middle lobe and left upper lobe lingula consistent with atelectasis or scarring. Lungs otherwise clear. No pleural effusion or  pneumothorax. Musculoskeletal: Old fracture of the sternal body. No acute fractures. No bone lesions. Mild degenerative changes of the thoracic spine. Review of the MIP images confirms the above findings. CTA ABDOMEN AND PELVIS FINDINGS VASCULAR Aorta: Normal caliber aorta without aneurysm, dissection, vasculitis or significant stenosis. Celiac: Patent without evidence of aneurysm, dissection, vasculitis or significant stenosis. SMA: Patent without evidence of aneurysm, dissection, vasculitis or significant stenosis. Renals: Both renal arteries are patent without evidence of aneurysm, dissection, vasculitis, fibromuscular dysplasia or significant stenosis. IMA: Patent without evidence of aneurysm, dissection, vasculitis or significant stenosis. Inflow: Patent without evidence of aneurysm, dissection, vasculitis or significant stenosis. Veins: No obvious venous abnormality within the limitations of this arterial phase study. Review of the MIP images confirms the above findings. NON-VASCULAR Hepatobiliary: No focal liver abnormality is seen. Status post cholecystectomy. No biliary dilatation. Pancreas: Unremarkable. No pancreatic ductal dilatation or surrounding inflammatory changes. Spleen: Normal in size without focal abnormality. Adrenals/Urinary Tract: Adrenal glands are unremarkable. Kidneys are normal, without renal calculi, focal lesion, or hydronephrosis. Bladder is unremarkable. Stomach/Bowel: Moderate hiatal hernia. Stomach otherwise unremarkable. Normal small bowel. Scattered colonic diverticula. Mild increase in colonic stool burden. No colon wall thickening or adjacent inflammation. Normal appendix visualized. Lymphatic: No enlarged lymph nodes. Reproductive: Uterus and bilateral adnexa are unremarkable. Other: No abdominal wall hernia or abnormality. No abdominopelvic ascites. Musculoskeletal: No fracture or acute finding. No bone lesion. Disc degenerative changes at L4-L5. Review of the MIP images  confirms the above findings. IMPRESSION: CTA FINDINGS 1. Normal. No aortic dissection. No aortic aneurysm and no atherosclerosis. Aortic branch vessels are all widely patent. NON CTA FINDINGS 1. No acute findings within the chest, abdomen or pelvis. 2. Moderate hiatal hernia. 3. Mild generalized increase in colonic stool burden. Scattered colonic diverticula. Electronically Signed   By: Amie Portlandavid  Ormond M.D.   On: 05/26/2020 17:27    Procedures Procedures (including critical care time)  Medications Ordered in ED Medications  sodium chloride flush (NS) 0.9 % injection 3 mL (3 mLs Intravenous Given 05/26/20 1613)  iohexol (OMNIPAQUE) 350 MG/ML injection 100 mL (100 mLs Intravenous Contrast Given 05/26/20 1704)    ED Course  I have reviewed the triage vital signs and the nursing notes.  Pertinent labs & imaging results that were available during my care of the patient were reviewed by me and considered in my medical decision making (see chart  for details).    MDM Rules/Calculators/A&P                          Concern for possible TIA versus aortic etiology given complaint of upper back pain associated with right lower extremity neurological symptoms. Blood pressure appears normal on arrival.  Neuro exam at this time is benign as well.  CT angios shows no evidence of aortic pathology.  Case discussed with neuro hospitalist Dr.Arora concern for possible TIA as a cause.  Patient be brought into the hospitalist group for work-up for TIA.    Final Clinical Impression(s) / ED Diagnoses Final diagnoses:  TIA (transient ischemic attack)    Rx / DC Orders ED Discharge Orders    None       Cheryll Cockayne, MD 05/26/20 [phone]

## 2020-05-26 NOTE — ED Notes (Signed)
Pt was called X2 for triage and could not be found.

## 2020-05-27 ENCOUNTER — Observation Stay (HOSPITAL_COMMUNITY): Payer: Medicare Other

## 2020-05-27 DIAGNOSIS — G459 Transient cerebral ischemic attack, unspecified: Secondary | ICD-10-CM | POA: Diagnosis not present

## 2020-05-27 DIAGNOSIS — I10 Essential (primary) hypertension: Secondary | ICD-10-CM | POA: Diagnosis not present

## 2020-05-27 DIAGNOSIS — R55 Syncope and collapse: Secondary | ICD-10-CM

## 2020-05-27 LAB — LIPID PANEL
Cholesterol: 246 mg/dL — ABNORMAL HIGH (ref 0–200)
HDL: 66 mg/dL (ref >40)
LDL Cholesterol: 167 mg/dL — ABNORMAL HIGH (ref 0–99)
Total CHOL/HDL Ratio: 3.7 RATIO
Triglycerides: 65 mg/dL (ref <150)
VLDL: 13 mg/dL (ref 0–40)

## 2020-05-27 LAB — BASIC METABOLIC PANEL
Anion gap: 9 (ref 5–15)
BUN: 11 mg/dL (ref 8–23)
CO2: 23 mmol/L (ref 22–32)
Calcium: 9.1 mg/dL (ref 8.9–10.3)
Chloride: 106 mmol/L (ref 98–111)
Creatinine, Ser: 0.53 mg/dL (ref 0.44–1.00)
GFR, Estimated: >60 mL/min (ref >60)
Glucose, Bld: 91 mg/dL (ref 70–99)
Potassium: 3.6 mmol/L (ref 3.5–5.1)
Sodium: 138 mmol/L (ref 135–145)

## 2020-05-27 LAB — CBC
HCT: 37.5 % (ref 36.0–46.0)
Hemoglobin: 12.1 g/dL (ref 12.0–15.0)
MCH: 30.9 pg (ref 26.0–34.0)
MCHC: 32.3 g/dL (ref 30.0–36.0)
MCV: 95.9 fL (ref 80.0–100.0)
Platelets: 215 10*3/uL (ref 150–400)
RBC: 3.91 MIL/uL (ref 3.87–5.11)
RDW: 12.5 % (ref 11.5–15.5)
WBC: 5.1 10*3/uL (ref 4.0–10.5)
nRBC: 0 % (ref 0.0–0.2)

## 2020-05-27 LAB — HEMOGLOBIN A1C
Hgb A1c MFr Bld: 5.5 % (ref 4.8–5.6)
Mean Plasma Glucose: 111.15 mg/dL

## 2020-05-27 LAB — HIV ANTIBODY (ROUTINE TESTING W REFLEX): HIV Screen 4th Generation wRfx: NONREACTIVE

## 2020-05-27 NOTE — Discharge Summary (Signed)
Physician Discharge Summary  BARBY COLVARD ZOX:[id number] DOB: 11/06/1955 DOA: 05/26/2020  PCP: Hadley Pen, MD  Admit date: 05/26/2020 Discharge date: 05/27/2020  Admitted From: Home Disposition: Home  Recommendations for Outpatient Follow-up:  1. Follow up with PCP in 1-2 weeks  Discharge Condition: Stable CODE STATUS: Full Diet recommendation: Regular diet  Brief/Interim Summary: Mckenzie Whitehead is a 64 y.o. female with medical history significant for asthma, hypertension, back pain, and bilateral leg cramps at night, now presenting to emergency department following an episode of near syncope and right leg numbness.  The patient reports that she had been in her usual state of health, had an uneventful day, had not eaten anything since this morning, and had been standing while watching a Christmas parade with her husband when she developed acute onset of lightheadedness, cold sweat, felt as though she was about to pass out, and also experienced right leg numbness with this.  She reached for her husband who helped her and kept her from falling.  She began to feel better quickly and symptoms completely resolved within approximately 15 minutes.  She had never experienced this previously.  There was no associated chest pain or palpitations.  She had some mild ache in her upper back associated with this.  She reports a history of back pain involving upper and lower back but denies any history of radiculopathy or syncope or near syncope.  She denies any recent fevers or chills.  Her husband notes that the patient's watch measures blood pressure and he saw that the diastolic pressure was 60 but does not recall what the top number was. Upon arrival to the ED, patient is found to be afebrile, saturating well on room air, and with stable blood pressure.  EKG features normal sinus rhythm.  Noncontrast head CT is negative for acute intracranial abnormality.  CT chest/abdomen/pelvis is negative  for acute findings.  Chemistry panel is notable for an elevated BUN to creatinine ratio.  CBC is unremarkable. High-sensitivity troponin is normal x2.  COVID-19 screening test not yet resulted.  Neurology was consulted by the ED physician and hospitalists asked to admit.  Patient admitted as above in observation status for episode of near syncope with right leg numbness and weakness.  Fortunately patient's imaging was negative, neurology recommended orthostatics and further outpatient evaluation if imaging were negative.  Patient otherwise remains asymptomatic no acute issues or events overnight, unclear etiology for her transient event, we discussed need for appropriate p.o. intake and to maintain hydration.  Otherwise patient stable and agreeable for discharge home.  She currently has a planned trip for [place], I see no reason why she could not continue this trip, would recommend close follow-up with PCP in the next 1 to 2 weeks to ensure no recurrent episodes or issues as well as repeat labs prior to leaving on her trip.  Discharge Diagnoses:  Principal Problem:   Near syncope Active Problems:   Right leg numbness   Back pain   Hypertension   Asthma    Discharge Instructions  Discharge Instructions    Diet - low sodium heart healthy   Complete by: As directed    Increase activity slowly   Complete by: As directed      Allergies as of 05/27/2020      Reactions   Gluten Meal Nausea And Vomiting      Medication List    TAKE these medications   albuterol 108 (90 Base) MCG/ACT inhaler Commonly known as: VENTOLIN HFA  Inhale 2 puffs into the lungs every 6 (six) hours as needed for wheezing or shortness of breath.   CALCIUM/D3 ADULT GUMMIES PO Take 2 each by mouth at bedtime.   CoQ10 100 MG Caps Take 1 tablet by mouth daily.   esomeprazole 20 MG capsule Commonly known as: NEXIUM Take 20 mg by mouth daily.   fexofenadine 180 MG tablet Commonly known as: ALLEGRA Take 180 mg  by mouth daily.   fluticasone 50 MCG/ACT nasal spray Commonly known as: FLONASE Place 1 spray into both nostrils daily as needed for allergies.   LYSINE PO Take 1 tablet by mouth at bedtime.   montelukast 10 MG tablet Commonly known as: SINGULAIR Take 10 mg by mouth daily.   Multivitamin Women 50+ Tabs Take 1 tablet by mouth daily.   telmisartan 40 MG tablet Commonly known as: MICARDIS Take 40 mg by mouth daily.   tiZANidine 4 MG capsule Commonly known as: ZANAFLEX Take 4 mg by mouth at bedtime.   TURMERIC PO Take 2 capsules by mouth daily.       Allergies  Allergen Reactions  . Gluten Meal Nausea And Vomiting    Consultations: Neurology  Procedures/Studies: CT HEAD WO CONTRAST  Result Date: 05/26/2020 CLINICAL DATA:  Neuro deficit.  Right leg weakness. EXAM: CT HEAD WITHOUT CONTRAST TECHNIQUE: Contiguous axial images were obtained from the base of the skull through the vertex without intravenous contrast. COMPARISON:  None. FINDINGS: Brain: No evidence of acute infarction, hemorrhage, hydrocephalus, extra-axial collection or mass lesion/mass effect. Vascular: No hyperdense vessel or unexpected calcification. Skull: Normal. Negative for fracture or focal lesion. Sinuses/Orbits: Polyp or retention cyst in the anterior right maxillary sinus. Focal mucosal disease or polyp type structure in the right ethmoid air cells. Other: None IMPRESSION: No acute intracranial abnormality. Electronically Signed   By: Richarda OverlieAdam  Henn M.D.   On: 05/26/2020 14:43   MR BRAIN WO CONTRAST  Result Date: 05/27/2020 CLINICAL DATA:  Syncope and right leg numbness EXAM: MRI HEAD WITHOUT CONTRAST MRI CERVICAL SPINE WITHOUT CONTRAST TECHNIQUE: Multiplanar, multiecho pulse sequences of the brain and surrounding structures, and cervical spine, to include the craniocervical junction and cervicothoracic junction, were obtained without intravenous contrast. COMPARISON:  None. FINDINGS: MRI HEAD FINDINGS Brain:  No acute infarct, mass effect or extra-axial collection. No acute or chronic hemorrhage. Normal white matter signal, parenchymal volume and CSF spaces. The midline structures are normal. Vascular: Major flow voids are preserved. Skull and upper cervical spine: Normal calvarium and skull base. Visualized upper cervical spine and soft tissues are normal. Sinuses/Orbits:No paranasal sinus fluid levels or advanced mucosal thickening. No mastoid or middle ear effusion. Normal orbits. MRI CERVICAL SPINE FINDINGS Alignment: Physiologic. Vertebrae: No fracture, evidence of discitis, or bone lesion. Cord: Normal signal and morphology. Posterior Fossa, vertebral arteries, paraspinal tissues: Negative. Disc levels: There is no disc herniation, spinal canal stenosis or neural foraminal stenosis. IMPRESSION: Normal MRI of the brain and cervical spine. Electronically Signed   By: Deatra RobinsonKevin  Herman M.D.   On: 05/27/2020 03:31   MR CERVICAL SPINE WO CONTRAST  Result Date: 05/27/2020 CLINICAL DATA:  Syncope and right leg numbness EXAM: MRI HEAD WITHOUT CONTRAST MRI CERVICAL SPINE WITHOUT CONTRAST TECHNIQUE: Multiplanar, multiecho pulse sequences of the brain and surrounding structures, and cervical spine, to include the craniocervical junction and cervicothoracic junction, were obtained without intravenous contrast. COMPARISON:  None. FINDINGS: MRI HEAD FINDINGS Brain: No acute infarct, mass effect or extra-axial collection. No acute or chronic hemorrhage. Normal white matter signal,  parenchymal volume and CSF spaces. The midline structures are normal. Vascular: Major flow voids are preserved. Skull and upper cervical spine: Normal calvarium and skull base. Visualized upper cervical spine and soft tissues are normal. Sinuses/Orbits:No paranasal sinus fluid levels or advanced mucosal thickening. No mastoid or middle ear effusion. Normal orbits. MRI CERVICAL SPINE FINDINGS Alignment: Physiologic. Vertebrae: No fracture, evidence of  discitis, or bone lesion. Cord: Normal signal and morphology. Posterior Fossa, vertebral arteries, paraspinal tissues: Negative. Disc levels: There is no disc herniation, spinal canal stenosis or neural foraminal stenosis. IMPRESSION: Normal MRI of the brain and cervical spine. Electronically Signed   By: Deatra Robinson M.D.   On: 05/27/2020 03:31   CT Angio Chest/Abd/Pel for Dissection W and/or W/WO  Result Date: 05/26/2020 CLINICAL DATA:  Abdominal pain. Pt states she was at [facility] and had sudden onset of R leg numbness that lasted approx 15 min. States she was sweating and had labored breathing. Now reports mid back pain and neck pain. EXAM: CT ANGIOGRAPHY CHEST, ABDOMEN AND PELVIS TECHNIQUE: Non-contrast CT of the chest was initially obtained. Multidetector CT imaging through the chest, abdomen and pelvis was performed using the standard protocol during bolus administration of intravenous contrast. Multiplanar reconstructed images and MIPs were obtained and reviewed to evaluate the vascular anatomy. CONTRAST:  OMNIPAQUE IOHEXOL 350 MG/ML SOLN COMPARISON:  08/29/2016 FINDINGS: CTA CHEST FINDINGS Cardiovascular: Well opacified aorta. Thoracic aorta is normal in caliber. No dissection. No atherosclerosis. Pulmonary arteries also well opacified. No evidence of a pulmonary embolus. Heart normal in size and configuration. No pericardial effusion. No coronary artery calcifications. Aortic arch branch vessels are widely patent. Mediastinum/Nodes: Moderate hiatal hernia. Normal thyroid. No neck base, mediastinal or hilar masses or enlarged lymph nodes. Trachea and esophagus are unremarkable. Lungs/Pleura: Minor linear opacities in the right middle lobe and left upper lobe lingula consistent with atelectasis or scarring. Lungs otherwise clear. No pleural effusion or pneumothorax. Musculoskeletal: Old fracture of the sternal body. No acute fractures. No bone lesions. Mild degenerative changes of the  thoracic spine. Review of the MIP images confirms the above findings. CTA ABDOMEN AND PELVIS FINDINGS VASCULAR Aorta: Normal caliber aorta without aneurysm, dissection, vasculitis or significant stenosis. Celiac: Patent without evidence of aneurysm, dissection, vasculitis or significant stenosis. SMA: Patent without evidence of aneurysm, dissection, vasculitis or significant stenosis. Renals: Both renal arteries are patent without evidence of aneurysm, dissection, vasculitis, fibromuscular dysplasia or significant stenosis. IMA: Patent without evidence of aneurysm, dissection, vasculitis or significant stenosis. Inflow: Patent without evidence of aneurysm, dissection, vasculitis or significant stenosis. Veins: No obvious venous abnormality within the limitations of this arterial phase study. Review of the MIP images confirms the above findings. NON-VASCULAR Hepatobiliary: No focal liver abnormality is seen. Status post cholecystectomy. No biliary dilatation. Pancreas: Unremarkable. No pancreatic ductal dilatation or surrounding inflammatory changes. Spleen: Normal in size without focal abnormality. Adrenals/Urinary Tract: Adrenal glands are unremarkable. Kidneys are normal, without renal calculi, focal lesion, or hydronephrosis. Bladder is unremarkable. Stomach/Bowel: Moderate hiatal hernia. Stomach otherwise unremarkable. Normal small bowel. Scattered colonic diverticula. Mild increase in colonic stool burden. No colon wall thickening or adjacent inflammation. Normal appendix visualized. Lymphatic: No enlarged lymph nodes. Reproductive: Uterus and bilateral adnexa are unremarkable. Other: No abdominal wall hernia or abnormality. No abdominopelvic ascites. Musculoskeletal: No fracture or acute finding. No bone lesion. Disc degenerative changes at L4-L5. Review of the MIP images confirms the above findings. IMPRESSION: CTA FINDINGS 1. Normal. No aortic dissection. No aortic aneurysm and no atherosclerosis.  Aortic  branch vessels are all widely patent. NON CTA FINDINGS 1. No acute findings within the chest, abdomen or pelvis. 2. Moderate hiatal hernia. 3. Mild generalized increase in colonic stool burden. Scattered colonic diverticula. Electronically Signed   By: Amie Portland M.D.   On: 05/26/2020 17:27     Subjective: No acute issues or events overnight   Discharge Exam: Vitals:   05/27/20 0800 05/27/20 0815  BP: (!) 144/72 (!) 145/96  Pulse: 72 88  Resp: 19 20  Temp:  97.8 F (36.6 C)  SpO2: 100% 98%   Vitals:   05/27/20 0630 05/27/20 0645 05/27/20 0800 05/27/20 0815  BP: (!) 146/82 (!) 142/76 (!) 144/72 (!) 145/96  Pulse: 77 77 72 88  Resp: 14 13 19 20   Temp:    97.8 F (36.6 C)  TempSrc:    Oral  SpO2: 98% 97% 100% 98%  Weight:      Height:        General: Pt is alert, awake, not in acute distress Cardiovascular: RRR, S1/S2 +, no rubs, no gallops Respiratory: CTA bilaterally, no wheezing, no rhonchi Abdominal: Soft, NT, ND, bowel sounds + Extremities: no edema, no cyanosis    The results of significant diagnostics from this hospitalization (including imaging, microbiology, ancillary and laboratory) are listed below for reference.     Microbiology: Recent Results (from the past 240 hour(s))  Resp Panel by RT-PCR (Flu A&B, Covid) Nasopharyngeal Swab     Status: None   Collection Time: 05/26/20  7:13 PM   Specimen: Nasopharyngeal Swab; Nasopharyngeal(NP) swabs in vial transport medium  Result Value Ref Range Status   SARS Coronavirus 2 by RT PCR NEGATIVE NEGATIVE Final    Comment: (NOTE) SARS-CoV-2 target nucleic acids are NOT DETECTED.  The SARS-CoV-2 RNA is generally detectable in upper respiratory specimens during the acute phase of infection. The lowest concentration of SARS-CoV-2 viral copies this assay can detect is 138 copies/mL. A negative result does not preclude SARS-Cov-2 infection and should not be used as the sole basis for treatment or other patient  management decisions. A negative result may occur with  improper specimen collection/handling, submission of specimen other than nasopharyngeal swab, presence of viral mutation(s) within the areas targeted by this assay, and inadequate number of viral copies(<138 copies/mL). A negative result must be combined with clinical observations, patient history, and epidemiological information. The expected result is Negative.  Fact Sheet for Patients:  14/04/21  Fact Sheet for Healthcare Providers:  BloggerCourse.com  This test is no t yet approved or cleared by the [place] FDA and  has been authorized for detection and/or diagnosis of SARS-CoV-2 by FDA under an Emergency Use Authorization (EUA). This EUA will remain  in effect (meaning this test can be used) for the duration of the COVID-19 declaration under Section 564(b)(1) of the Act, 21 U.S.C.section 360bbb-3(b)(1), unless the authorization is terminated  or revoked sooner.       Influenza A by PCR NEGATIVE NEGATIVE Final   Influenza B by PCR NEGATIVE NEGATIVE Final    Comment: (NOTE) The Xpert Xpress SARS-CoV-2/FLU/RSV plus assay is intended as an aid in the diagnosis of influenza from Nasopharyngeal swab specimens and should not be used as a sole basis for treatment. Nasal washings and aspirates are unacceptable for Xpert Xpress SARS-CoV-2/FLU/RSV testing.  Fact Sheet for Patients: [place]  Fact Sheet for Healthcare Providers: BloggerCourse.com  This test is not yet approved or cleared by the [employer] and has been authorized for  detection and/or diagnosis of SARS-CoV-2 by FDA under an Emergency Use Authorization (EUA). This EUA will remain in effect (meaning this test can be used) for the duration of the COVID-19 declaration under Section 564(b)(1) of the Act, 21 U.S.C. section 360bbb-3(b)(1),  unless the authorization is terminated or revoked.  Performed at [facility], [address] [place], [place] [address]      Labs: BNP (last 3 results) No results for input(s): BNP in the last 8760 hours. Basic Metabolic Panel: Recent Labs  Lab 05/26/20 1414 05/27/20 0506  NA 136 138  K 4.1 3.6  CL 103 106  CO2 23 23  GLUCOSE 115* 91  BUN 24* 11  CREATININE 0.68 0.53  CALCIUM 9.5 9.1   Liver Function Tests: Recent Labs  Lab 05/26/20 1414  AST 19  ALT 16  ALKPHOS 48  BILITOT 0.8  PROT 6.7  ALBUMIN 3.9   No results for input(s): LIPASE, AMYLASE in the last 168 hours. No results for input(s): AMMONIA in the last 168 hours. CBC: Recent Labs  Lab 05/26/20 1414 05/27/20 0506  WBC 6.3 5.1  NEUTROABS 3.7  --   HGB 12.0 12.1  HCT 37.7 37.5  MCV 96.4 95.9  PLT 269 215   Cardiac Enzymes: No results for input(s): CKTOTAL, CKMB, CKMBINDEX, TROPONINI in the last 168 hours. BNP: Invalid input(s): POCBNP CBG: No results for input(s): GLUCAP in the last 168 hours. D-Dimer No results for input(s): DDIMER in the last 72 hours. Hgb A1c Recent Labs    05/27/20 0506  HGBA1C 5.5   Lipid Profile Recent Labs    05/27/20 0506  CHOL 246*  HDL 66  LDLCALC 167*  TRIG 65  CHOLHDL 3.7   Thyroid function studies No results for input(s): TSH, T4TOTAL, T3FREE, THYROIDAB in the last 72 hours.  Invalid input(s): FREET3 Anemia work up No results for input(s): VITAMINB12, FOLATE, FERRITIN, TIBC, IRON, RETICCTPCT in the last 72 hours. Urinalysis    Component Value Date/Time   COLORURINE STRAW (A) 08/28/2016 2333   APPEARANCEUR HAZY (A) 08/28/2016 2333   LABSPEC 1.015 11/09/2016 1420   PHURINE 6.0 11/09/2016 1420   GLUCOSEU NEGATIVE 11/09/2016 1420   HGBUR NEGATIVE 11/09/2016 1420   BILIRUBINUR SMALL (A) 11/09/2016 1420   KETONESUR NEGATIVE 11/09/2016 1420   PROTEINUR 30 (A) 11/09/2016 1420   UROBILINOGEN 0.2 11/09/2016 1420   NITRITE NEGATIVE 11/09/2016  1420   LEUKOCYTESUR TRACE (A) 11/09/2016 1420   Sepsis Labs Invalid input(s): PROCALCITONIN,  WBC,  LACTICIDVEN Microbiology Recent Results (from the past 240 hour(s))  Resp Panel by RT-PCR (Flu A&B, Covid) Nasopharyngeal Swab     Status: None   Collection Time: 05/26/20  7:13 PM   Specimen: Nasopharyngeal Swab; Nasopharyngeal(NP) swabs in vial transport medium  Result Value Ref Range Status   SARS Coronavirus 2 by RT PCR NEGATIVE NEGATIVE Final    Comment: (NOTE) SARS-CoV-2 target nucleic acids are NOT DETECTED.  The SARS-CoV-2 RNA is generally detectable in upper respiratory specimens during the acute phase of infection. The lowest concentration of SARS-CoV-2 viral copies this assay can detect is 138 copies/mL. A negative result does not preclude SARS-Cov-2 infection and should not be used as the sole basis for treatment or other patient management decisions. A negative result may occur with  improper specimen collection/handling, submission of specimen other than nasopharyngeal swab, presence of viral mutation(s) within the areas targeted by this assay, and inadequate number of viral copies(<138 copies/mL). A negative result must be combined with  clinical observations, patient history, and epidemiological information. The expected result is Negative.  Fact Sheet for Patients:  BloggerCourse.com  Fact Sheet for Healthcare Providers:  [employer]  This test is no t yet approved or cleared by the [place] FDA and  has been authorized for detection and/or diagnosis of SARS-CoV-2 by FDA under an Emergency Use Authorization (EUA). This EUA will remain  in effect (meaning this test can be used) for the duration of the COVID-19 declaration under Section 564(b)(1) of the Act, 21 U.S.C.section 360bbb-3(b)(1), unless the authorization is terminated  or revoked sooner.       Influenza A by PCR NEGATIVE NEGATIVE Final    Influenza B by PCR NEGATIVE NEGATIVE Final    Comment: (NOTE) The Xpert Xpress SARS-CoV-2/FLU/RSV plus assay is intended as an aid in the diagnosis of influenza from Nasopharyngeal swab specimens and should not be used as a sole basis for treatment. Nasal washings and aspirates are unacceptable for Xpert Xpress SARS-CoV-2/FLU/RSV testing.  Fact Sheet for Patients: BloggerCourse.com  Fact Sheet for Healthcare Providers: [employer]ious[employer]ker.it  This test is not yet approved or cleared by the [place] FDA and has been authorized for detection and/or diagnosis of SARS-CoV-2 by FDA under an Emergency Use Authorization (EUA). This EUA will remain in effect (meaning this test can be used) for the duration of the COVID-19 declaration under Section 564(b)(1) of the Act, 21 U.S.C. section 360bbb-3(b)(1), unless the authorization is terminated or revoked.  Performed at [facility], [address], [place], [place] [address]      Time coordinating discharge: Over 30 minutes  SIGNED:   Azucena Fallen, DO [facility] 05/27/2020, 8:43 AM Pager   If 7PM-7AM, please contact night-coverage [email or web address]

## 2020-05-27 NOTE — Consult Note (Signed)
Neurology Consultation Reason for Consult: Right leg weakness Requesting Physician: Marcial Pacas Opyd   CC: Right leg transient numbness / weakness  History is obtained from: patient and chart review   HPI: Mckenzie Whitehead is a 64 y.o. female with a past medical history significant for mild hypertension, likely mild hyperlipidemia, asthma, hiatal hernia, celiac disease.  She reports that she had been in her usual state of health but had gone to a parade with her husband.  She had been standing for over an hour when she suddenly felt that her right leg was weak and numb.  She had associated severe 8/10 neck and back pain, felt diaphoretic, nauseated, and like she may pass out.  She was assisted by her husband away from the parade route to sit for a while.  She reports that the episode itself of leg weakness/numbness lasted 3 to 5 minutes, and then gradually recovered over the next 3 to 5 minutes.  She feels that she is currently at her baseline.  Review of systems is notable for leg cramping at night which has been ongoing for the past 3 to 6 months hasn't improved with Zanaflex prescribed by her PCP which she is taking almost nightly.  She initially had some mild pain in her right upper arm that has been improving in the last 4 weeks.  She had her Covid booster at the end of October.   LKW: ~1 PM 12/4  tPA given?: No, due to symptoms resolved Premorbid modified rankin scale:     0 - No symptoms.  ROS: A 14 point ROS was performed and is negative except as noted in the HPI.   Past Medical History:  Diagnosis Date  . Asthma   . Hypertension    Past Surgical History:  Procedure Laterality Date  . CHOLECYSTECTOMY    . ELBOW SURGERY  2011  . MANDIBLE SURGERY     Current Outpatient Medications  Medication Instructions  . albuterol (PROVENTIL HFA;VENTOLIN HFA) 108 (90 BASE) MCG/ACT inhaler 2 puffs, Inhalation, Every 6 hours PRN  . Calcium-Phosphorus-Vitamin D (CALCIUM/D3 ADULT GUMMIES PO)  2 each, Oral, Daily at bedtime  . Coenzyme Q10 (COQ10) 100 MG CAPS 1 tablet, Oral, Daily  . esomeprazole (NEXIUM) 20 mg, Oral, Daily  . fexofenadine (ALLEGRA) 180 mg, Oral, Daily  . fluticasone (FLONASE) 50 MCG/ACT nasal spray 1 spray, Each Nare, Daily PRN  . LYSINE PO 1 tablet, Oral, Daily at bedtime  . montelukast (SINGULAIR) 10 mg, Oral, Daily  . Multiple Vitamins-Minerals (MULTIVITAMIN WOMEN 50+) TABS 1 tablet, Oral, Daily  . telmisartan (MICARDIS) 40 mg, Oral, Daily  . tiZANidine (ZANAFLEX) 4 mg, Oral, Daily at bedtime  . TURMERIC PO 2 capsules, Oral, Daily    History reviewed. No pertinent family history.  Social History:  reports that she has never smoked. She has never used smokeless tobacco. She reports current drug use. She reports that she does not drink alcohol. --I believe this is a typo in our EMR.  She does not report any drug use past or present to me.  Exam: Current vital signs: BP 126/77   Pulse 78   Temp (!) 97.5 F (36.4 C) (Oral)   Resp 16   Ht 5\' 8"  (1.727 m)   Wt 86.6 kg   SpO2 98%   BMI 29.04 kg/m  Vital signs in last 24 hours: Temp:  [97.5 F (36.4 C)] 97.5 F (36.4 C) (12/04 1345) Pulse Rate:  [73-83] 78 (12/04 2230) Resp:  [16-22] 16 (  12/04 2230) BP: (126-139)/(68-77) 126/77 (12/04 2230) SpO2:  [95 %-100 %] 98 % (12/04 2230) Weight:  [86.6 kg] 86.6 kg (12/04 1345) Notably BP dropped from baseline of 140s to 90/60 when I had stood patient up to examine her. She endorsed some dizziness with this.   Physical Exam  Constitutional: Appears well-developed and well-nourished.  Psych: Affect appropriate to situation, mildly anxious  Eyes: No scleral injection HENT: No OP obstruction  MSK: no joint deformities.  She does have point tenderness on palpation of approximately C3 & C7 spinous processes Cardiovascular: Normal rate and regular rhythm.  Respiratory: Effort normal, non-labored breathing GI: Soft.  No distension. There is no tenderness.   Skin: WDI  Neuro: Mental Status: Patient is awake, alert, oriented to person, place, month, year, and situation. Patient is able to give a clear and coherent history. No signs of aphasia or neglect other than some very mild difficulty with low-frequency words such as the frame of her glasses, endoscopy, colonoscopy Cranial Nerves: II: Visual Fields are full. Pupils are equal, round, and reactive to light.  III,IV, VI: EOMI without ptosis or diploplia.  V: Facial sensation is symmetric to temperature VII: Facial movement is symmetric.  VIII: hearing is intact to voice X: Uvula elevates symmetrically XI: Shoulder shrug is symmetric. XII: tongue is midline without atrophy or fasciculations.  Motor: Tone is normal. Bulk is normal. 5/5 strength was present in all four extremities other than right hip flexor weakness 4/5 Sensory: Sensation is symmetric to light touch in the arms and legs. Deep Tendon Reflexes: 3+ and symmetric in the brachioradialis and patellae.  No clonus, no Hoffman's Plantars: Toes are downgoing bilaterally.  Cerebellar: FNF and HKS are intact bilaterally other than some very slight dysmetria of the left upper extremity Gait: Able to rise on her heels and her toes.  Very slight unsteadiness with tandem gait  NIHSS total 1 (LUE dysmetria)  I have reviewed labs in epic and the results pertinent to this consultation are: Mildly elevated glucose of 115, BUN 24, creatinine 0.68  I have reviewed the images obtained:  HCT personally reviewed, no acute intracranial process  MRI brain and cervical spine reviewed no acute process  Brain w/ mild chronic microvascular disease    Impression: This is a 64 year old woman with significant vascular risk factors of mild hypertension, mild hyperlipidemia, presenting with an episode of sudden onset right leg numbness/weakness in the setting of prolonged standing at a parade.  This was associated with neck and back pain,  diaphoresis, nausea and presyncope.  I think the most likely explanation is a vasovagal response in setting of neck pain.  Her exam is notable for point tenderness in the cervical spine as well as mild 4/5 right hip weakness and hyperreflexia, she also had some very mild word finding difficulty for low-frequency words such as colonoscopy and endoscopy, which may have been due to the late hour of my examination.  Etiology of hyperreflexia may be secondary to anxiety or cervical spine compression. Additionally she does appear to be orthostatic as well   Recommendations: -MRI brain and cervical spine -If these studies are negative for acute processes, no further inpatient work-up needed -Orthostasis management per primary team  Brooke Dare MD-PhD [facility] [phone]  Update: MRI brain and C-spine reviewed.  Agree with radiology that there was no acute process.  Patient is appropriate for outpatient follow-up.  Neurology will sign off at this time, kindly reach out to [place] if there are new acute  neurological issues that arise for this patient.

## 2020-05-27 NOTE — ED Notes (Signed)
Tele  Breakfast Ordered 

## 2021-03-31 ENCOUNTER — Other Ambulatory Visit: Payer: Self-pay

## 2021-03-31 ENCOUNTER — Ambulatory Visit (HOSPITAL_COMMUNITY)
Admission: EM | Admit: 2021-03-31 | Discharge: 2021-03-31 | Disposition: A | Discharge status: Home / Self Care | Payer: Medicare Other | Attending: Student | Admitting: Student

## 2021-03-31 ENCOUNTER — Encounter (HOSPITAL_COMMUNITY): Payer: Self-pay

## 2021-03-31 DIAGNOSIS — T23251A Burn of second degree of right palm, initial encounter: Secondary | ICD-10-CM

## 2021-03-31 DIAGNOSIS — T23201A Burn of second degree of right hand, unspecified site, initial encounter: Secondary | ICD-10-CM

## 2021-03-31 MED ORDER — TRAMADOL HCL 50 MG PO TABS
50.0000 mg | ORAL_TABLET | Freq: Four times a day (QID) | ORAL | 0 refills | Status: AC | PRN
Start: 2021-03-31 — End: ?

## 2021-03-31 MED ORDER — TRAMADOL HCL 50 MG PO TABS
50.0000 mg | ORAL_TABLET | Freq: Four times a day (QID) | ORAL | 0 refills | Status: DC | PRN
Start: 2021-03-31 — End: 2021-03-31

## 2021-03-31 MED ORDER — SILVER SULFADIAZINE 1 % EX CREA: 1.0000 "application " | TOPICAL_CREAM | Freq: Every day | CUTANEOUS | 0 refills | Status: DC

## 2021-03-31 NOTE — ED Triage Notes (Signed)
Pt presents with a burn on the R hand. Pt states she put a pan in the oven at 420 degrees F. States she picked it up with her hand.

## 2021-03-31 NOTE — Discharge Instructions (Addendum)
-  Silvadene cream at bedtime for about 7 days -Tramadol for pain, up to every 6 hours. This medication can cause drowsiness, so don't take before driving. Don't drink alcohol while on this medication. -You can take Tylenol up to 1000 mg 3 times daily -Ice, rest -Wash your wound with gentle soap and water 1-2 times daily.  Let air dry or gently pat. Keep wrapped during the day or when you're doing something that could get it dirty (working, [employer], cooking, Catering manager). Avoid cleansing with hydrogen peroxide or alcohol!! -Seek additional medical attention if the wound is getting worse instead of better- redness increasing in size, pain getting worse, new/worsening discharge, new fevers/chills, etc.

## 2021-03-31 NOTE — ED Provider Notes (Signed)
[facility]    CSN: [id number] Arrival date & time: 03/31/21  1559      History   Chief Complaint Chief Complaint  Patient presents with   Hand Burn    HPI Mckenzie Whitehead is a 65 y.o. female presenting with a burn on the right hand sustained earlier today when she accidentally grabbed a pan that she just taken out of the oven with her right hand.  States the pain was about 20 F.  Significant pain to the palmar aspect of the right hand, without sensation changes or discharge.  Her Tdap is up-to-date as she got this when her new grand child was born 6 months ago.  She is not immunocompromise. She is right handed.   HPI  Past Medical History:  Diagnosis Date   Asthma    Hypertension     Patient Active Problem List   Diagnosis Date Noted   Right leg numbness 05/26/2020   Back pain 05/26/2020   Hypertension    Asthma    Near syncope     Past Surgical History:  Procedure Laterality Date   CHOLECYSTECTOMY     ELBOW SURGERY  2011   MANDIBLE SURGERY      OB History   No obstetric history on file.      Home Medications    Prior to Admission medications   Medication Sig Start Date End Date Taking? Authorizing Provider  silver sulfADIAZINE (SILVADENE) 1 % cream Apply 1 application topically daily. At bedtime for about 7 days 03/31/21  Yes Rhys Martini, PA-C  albuterol (PROVENTIL HFA;VENTOLIN HFA) 108 (90 BASE) MCG/ACT inhaler Inhale 2 puffs into the lungs every 6 (six) hours as needed for wheezing or shortness of breath.     [provider]  Calcium-Phosphorus-Vitamin D (CALCIUM/D3 ADULT GUMMIES PO) Take 2 each by mouth at bedtime.    [provider]  Coenzyme Q10 (COQ10) 100 MG CAPS Take 1 tablet by mouth daily.    [provider]  esomeprazole (NEXIUM) 20 MG capsule Take 20 mg by mouth daily.     [provider]  fexofenadine (ALLEGRA) 180 MG tablet Take 180 mg by mouth daily.    [provider]   fluticasone (FLONASE) 50 MCG/ACT nasal spray Place 1 spray into both nostrils daily as needed for allergies.     [provider]  LYSINE PO Take 1 tablet by mouth at bedtime.    [provider]  montelukast (SINGULAIR) 10 MG tablet Take 10 mg by mouth daily.     [provider]  Multiple Vitamins-Minerals (MULTIVITAMIN WOMEN 50+) TABS Take 1 tablet by mouth daily.    [provider]  telmisartan (MICARDIS) 40 MG tablet Take 40 mg by mouth daily. 05/12/20   [provider]  tiZANidine (ZANAFLEX) 4 MG capsule Take 4 mg by mouth at bedtime.  02/15/20   [provider]  traMADol (ULTRAM) 50 MG tablet Take 1 tablet (50 mg total) by mouth every 6 (six) hours as needed. 03/31/21   Rhys Martini, PA-C  TURMERIC PO Take 2 capsules by mouth daily.     [provider]    Family History History reviewed. No pertinent family history.  Social History Social History   Tobacco Use   Smoking status: Never   Smokeless tobacco: Never  Substance Use Topics   Alcohol use: No   Drug use: Yes     Allergies   Gluten meal   Review of  Systems Review of Systems  Skin:  Positive for wound.  All other systems reviewed and are negative.   Physical Exam Triage Vital Signs ED Triage Vitals  Enc Vitals Group     BP 03/31/21 1729 (!) 171/105     Pulse Rate 03/31/21 1729 78     Resp 03/31/21 1729 16     Temp 03/31/21 1729 98.1 F (36.7 C)     Temp Source 03/31/21 1729 Oral     SpO2 03/31/21 1729 99 %     Weight --      Height --      Head Circumference --      Peak Flow --      Pain Score 03/31/21 1727 5     Pain Loc --      Pain Edu? --      Excl. in GC? --    No data found.  Updated Vital Signs BP (!) 171/105 (BP Location: Right Arm)   Pulse 78   Temp 98.1 F (36.7 C) (Oral)   Resp 16   SpO2 99%   Visual Acuity Right Eye Distance:   Left Eye Distance:   Bilateral Distance:    Right Eye Near:   Left Eye Near:     Bilateral Near:     Physical Exam Vitals reviewed.  Constitutional:      General: She is not in acute distress.    Appearance: Normal appearance. She is not ill-appearing or diaphoretic.  HENT:     Head: Normocephalic and atraumatic.  Cardiovascular:     Rate and Rhythm: Normal rate and regular rhythm.     Heart sounds: Normal heart sounds.  Pulmonary:     Effort: Pulmonary effort is normal.     Breath sounds: Normal breath sounds.  Skin:    General: Skin is warm.     Findings: Burn present.     Comments: See image below  R hand- palm with scattered erythematous second degree burn, worst on 3rd and 4th fingertips and base of thumb. Blister forming at base of thumb. Sensation intact. Exquisitely tender. No discharge. Burns do not extend past palmar aspect; no circumferential or full thickness burns. ROM fingers flexion and extension intact. Cap refill <2 seconds throughout. Radial pulse 2+.  Neurological:     General: No focal deficit present.     Mental Status: She is alert and oriented to person, place, and time.  Psychiatric:        Mood and Affect: Mood normal.        Behavior: Behavior normal.        Thought Content: Thought content normal.        Judgment: Judgment normal.        UC Treatments / Results  Labs (all labs ordered are listed, but only abnormal results are displayed) Labs Reviewed - No data to display  EKG   Radiology No results found.  Procedures Procedures (including critical care time)  Medications Ordered in UC Medications - No data to display  Initial Impression / Assessment and Plan / UC Course  I have reviewed the triage vital signs and the nursing notes.  Pertinent labs & imaging results that were available during my care of the patient were reviewed by me and considered in my medical decision making (see chart for details).     This patient is a very pleasant 65 y.o. year old female presenting with 2nd degree burn palmar aspect R  hand x1 day. Neurovascularly  intact. Silvadene cream and short course of tramadol (5 tabs) for pain relief. She is not immunocompromised. Tdap UTD 08/2020. ED return precautions discussed. Patient verbalizes understanding and agreement.    Final Clinical Impressions(s) / UC Diagnoses   Final diagnoses:  Burn of multiple sites of hand, second degree, right, initial encounter     Discharge Instructions      -Silvadene cream at bedtime for about 7 days -Tramadol for pain, up to every 6 hours. This medication can cause drowsiness, so don't take before driving. Don't drink alcohol while on this medication. -You can take Tylenol up to 1000 mg 3 times daily -Ice, rest -Wash your wound with gentle soap and water 1-2 times daily.  Let air dry or gently pat. Keep wrapped during the day or when you're doing something that could get it dirty (working, [employer], cooking, Catering manager). Avoid cleansing with hydrogen peroxide or alcohol!! -Seek additional medical attention if the wound is getting worse instead of better- redness increasing in size, pain getting worse, new/worsening discharge, new fevers/chills, etc.      ED Prescriptions     Medication Sig Dispense Auth. Provider   silver sulfADIAZINE (SILVADENE) 1 % cream Apply 1 application topically daily. At bedtime for about 7 days 50 g Rhys Martini, PA-C   traMADol (ULTRAM) 50 MG tablet  (Status: Discontinued) Take 1 tablet (50 mg total) by mouth every 6 (six) hours as needed. 5 tablet Rhys Martini, PA-C   traMADol (ULTRAM) 50 MG tablet Take 1 tablet (50 mg total) by mouth every 6 (six) hours as needed. 5 tablet Rhys Martini, PA-C      I have reviewed the PDMP during this encounter.   Rhys Martini, PA-C 04/02/21 [phone]

## 2021-06-03 ENCOUNTER — Encounter: Payer: Self-pay | Admitting: Emergency Medicine

## 2021-06-03 ENCOUNTER — Ambulatory Visit
Admission: EM | Admit: 2021-06-03 | Discharge: 2021-06-03 | Disposition: A | Discharge status: Home / Self Care | Payer: Medicare Other | Attending: Internal Medicine | Admitting: Internal Medicine

## 2021-06-03 ENCOUNTER — Other Ambulatory Visit: Payer: Self-pay

## 2021-06-03 DIAGNOSIS — J069 Acute upper respiratory infection, unspecified: Secondary | ICD-10-CM | POA: Diagnosis not present

## 2021-06-03 DIAGNOSIS — R030 Elevated blood-pressure reading, without diagnosis of hypertension: Secondary | ICD-10-CM

## 2021-06-03 MED ORDER — PREDNISONE 10 MG (21) PO TBPK: ORAL_TABLET | Freq: Every day | ORAL | 0 refills | Status: DC

## 2021-06-03 MED ORDER — BENZONATATE 100 MG PO CAPS: 100.0000 mg | ORAL_CAPSULE | Freq: Three times a day (TID) | ORAL | 0 refills | Status: DC | PRN

## 2021-06-03 NOTE — ED Triage Notes (Signed)
Had a URI x 2 weeks, got better, then got it again. Hx of asthma. States she's had some nasal drainage/congestion, cough, mucus in her lungs recently.

## 2021-06-03 NOTE — Discharge Instructions (Addendum)
It appears that you have a viral upper respiratory infection that should resolve in the next few days with symptomatic treatment.  You have been prescribed prednisone steroid to help alleviate inflammation in your chest and help with cough.  A cough medication has also been prescribed to help alleviate cough.  Please follow-up if symptoms persist.

## 2021-06-03 NOTE — ED Provider Notes (Addendum)
[facility]    CSN: [id number] Arrival date & time: 06/03/21  1225      History   Chief Complaint Chief Complaint  Patient presents with   Cough    HPI Mckenzie Whitehead is a 65 y.o. female.   Patient presents with 2-week history of nasal congestion and cough.  Patient reports that her symptoms resolved for approximately 3 days and then returned.  Cough is nonproductive per patient.  Patient does have some intermittent shortness of breath with exertion but denies any chest pain.  She does report that she have a history of asthma and has been having to use her albuterol inhaler more often while being sick.  Denies sore throat, ear pain, nausea, vomiting, diarrhea, fever.  She was exposed to COVID-19 when symptoms first started.   Cough  Past Medical History:  Diagnosis Date   Asthma    Hypertension     Patient Active Problem List   Diagnosis Date Noted   Right leg numbness 05/26/2020   Back pain 05/26/2020   Hypertension    Asthma    Near syncope     Past Surgical History:  Procedure Laterality Date   CHOLECYSTECTOMY     ELBOW SURGERY  2011   MANDIBLE SURGERY      OB History   No obstetric history on file.      Home Medications    Prior to Admission medications   Medication Sig Start Date End Date Taking? Authorizing Provider  benzonatate (TESSALON) 100 MG capsule Take 1 capsule (100 mg total) by mouth every 8 (eight) hours as needed for cough. 06/03/21  Yes Tarence Searcy, Hildred Alamin E, FNP  predniSONE (STERAPRED UNI-PAK 21 TAB) 10 MG (21) TBPK tablet Take by mouth daily. Take 6 tabs by mouth daily  for 2 days, then 5 tabs for 2 days, then 4 tabs for 2 days, then 3 tabs for 2 days, 2 tabs for 2 days, then 1 tab by mouth daily for 2 days 06/03/21  Yes Mehran Guderian, Hildred Alamin E, FNP  albuterol (PROVENTIL HFA;VENTOLIN HFA) 108 (90 BASE) MCG/ACT inhaler Inhale 2 puffs into the lungs every 6 (six) hours as needed for wheezing or shortness of breath.     [provider]  Calcium-Phosphorus-Vitamin D (CALCIUM/D3 ADULT GUMMIES PO) Take 2 each by mouth at bedtime.    [provider]  Coenzyme Q10 (COQ10) 100 MG CAPS Take 1 tablet by mouth daily.    [provider]  esomeprazole (NEXIUM) 20 MG capsule Take 20 mg by mouth daily.     [provider]  fexofenadine (ALLEGRA) 180 MG tablet Take 180 mg by mouth daily.    [provider]  fluticasone (FLONASE) 50 MCG/ACT nasal spray Place 1 spray into both nostrils daily as needed for allergies.     [provider]  LYSINE PO Take 1 tablet by mouth at bedtime.    [provider]  montelukast (SINGULAIR) 10 MG tablet Take 10 mg by mouth daily.     [provider]  Multiple Vitamins-Minerals (MULTIVITAMIN WOMEN 50+) TABS Take 1 tablet by mouth daily.    [provider]  silver sulfADIAZINE (SILVADENE) 1 % cream Apply 1 application topically daily. At bedtime for about 7 days 03/31/21   Hazel Sams, PA-C  telmisartan (MICARDIS) 40 MG tablet Take 40 mg by mouth daily. 05/12/20   [provider]  tiZANidine (ZANAFLEX) 4 MG capsule Take 4 mg by mouth at bedtime.  02/15/20  [provider]  traMADol (ULTRAM) 50 MG tablet Take 1 tablet (50 mg total) by mouth every 6 (six) hours as needed. 03/31/21   Rhys Martini, PA-C  TURMERIC PO Take 2 capsules by mouth daily.     [provider]    Family History History reviewed. No pertinent family history.  Social History Social History   Tobacco Use   Smoking status: Never   Smokeless tobacco: Never  Substance Use Topics   Alcohol use: No   Drug use: Yes     Allergies   Gluten meal   Review of Systems Review of Systems Per HPI  Physical Exam Triage Vital Signs ED Triage Vitals  Enc Vitals Group     BP 06/03/21 1414 (!) 183/74     Pulse Rate 06/03/21 1414 84     Resp 06/03/21 1414 16     Temp 06/03/21 1414 97.7 F (36.5 C)     Temp Source 06/03/21  1414 Oral     SpO2 06/03/21 1414 99 %     Weight --      Height --      Head Circumference --      Peak Flow --      Pain Score 06/03/21 1416 0     Pain Loc --      Pain Edu? --      Excl. in GC? --    No data found.  Updated Vital Signs BP (!) 149/72 (BP Location: Left Arm)   Pulse 84   Temp 97.7 F (36.5 C) (Oral)   Resp 16   SpO2 99%   Visual Acuity Right Eye Distance:   Left Eye Distance:   Bilateral Distance:    Right Eye Near:   Left Eye Near:    Bilateral Near:     Physical Exam Constitutional:      General: She is not in acute distress.    Appearance: Normal appearance. She is not toxic-appearing or diaphoretic.  HENT:     Head: Normocephalic and atraumatic.     Right Ear: Tympanic membrane and ear canal normal.     Left Ear: Tympanic membrane and ear canal normal.     Nose: Congestion present.     Mouth/Throat:     Mouth: Mucous membranes are moist.     Pharynx: No posterior oropharyngeal erythema.  Eyes:     Extraocular Movements: Extraocular movements intact.     Conjunctiva/sclera: Conjunctivae normal.     Pupils: Pupils are equal, round, and reactive to light.  Cardiovascular:     Rate and Rhythm: Normal rate and regular rhythm.     Pulses: Normal pulses.     Heart sounds: Normal heart sounds.  Pulmonary:     Effort: Pulmonary effort is normal. No respiratory distress.     Breath sounds: Normal breath sounds. No stridor. No wheezing, rhonchi or rales.  Abdominal:     General: Abdomen is flat. Bowel sounds are normal.     Palpations: Abdomen is soft.  Musculoskeletal:        General: Normal range of motion.     Cervical back: Normal range of motion.  Skin:    General: Skin is warm and dry.  Neurological:     General: No focal deficit present.     Mental Status: She is alert and oriented to person, place, and time. Mental status is at baseline.  Psychiatric:        Mood and Affect: Mood normal.  Behavior: Behavior normal.     UC  Treatments / Results  Labs (all labs ordered are listed, but only abnormal results are displayed) Labs Reviewed - No data to display  EKG   Radiology No results found.  Procedures Procedures (including critical care time)  Medications Ordered in UC Medications - No data to display  Initial Impression / Assessment and Plan / UC Course  I have reviewed the triage vital signs and the nursing notes.  Pertinent labs & imaging results that were available during my care of the patient were reviewed by me and considered in my medical decision making (see chart for details).     Patient's symptoms appear viral in etiology especially with her COVID-19 exposure.  Do not think that COVID testing is necessary at this time given duration of symptoms.  High risk for asthma exacerbation given patient's history of asthma as well as current intermittent shortness of breath.  Will prescribe prednisone steroid taper and cough medication to help alleviate symptoms as well as possible asthma exacerbation.  No adventitious lung sounds on exam so do not think that chest imaging is necessary.  She was offered chest imaging but declined with shared decision making.  Patient does have elevated blood pressure reading on initial triage but blood pressure recovered to [id number] systolic on retake.  Advised patient to monitor blood pressure at home and to follow-up with PCP if blood pressure remains elevated.  She does report that she has not taken her blood pressure medication today.  Discussed strict return precautions.  Patient verbalized understanding and was agreeable with plan. Final Clinical Impressions(s) / UC Diagnoses   Final diagnoses:  Viral upper respiratory tract infection with cough  Elevated blood pressure reading     Discharge Instructions      It appears that you have a viral upper respiratory infection that should resolve in the next few days with symptomatic treatment.  You have been prescribed  prednisone steroid to help alleviate inflammation in your chest and help with cough.  A cough medication has also been prescribed to help alleviate cough.  Please follow-up if symptoms persist.    ED Prescriptions     Medication Sig Dispense Auth. Provider   predniSONE (STERAPRED UNI-PAK 21 TAB) 10 MG (21) TBPK tablet Take by mouth daily. Take 6 tabs by mouth daily  for 2 days, then 5 tabs for 2 days, then 4 tabs for 2 days, then 3 tabs for 2 days, 2 tabs for 2 days, then 1 tab by mouth daily for 2 days 42 tablet Terry Bolotin, [place], [place]   benzonatate (TESSALON) 100 MG capsule Take 1 capsule (100 mg total) by mouth every 8 (eight) hours as needed for cough. 21 capsule [employer], Michele Rockers, [place]      PDMP not reviewed this encounter.   Teodora Medici, [place] 06/03/21 [place], [place], [place] 06/03/21 1459

## 2021-12-30 ENCOUNTER — Other Ambulatory Visit: Payer: Self-pay

## 2021-12-30 ENCOUNTER — Ambulatory Visit
Admission: RE | Admit: 2021-12-30 | Discharge: 2021-12-30 | Disposition: A | Discharge status: Home / Self Care | Payer: Medicare Other | Source: Ambulatory Visit

## 2021-12-30 VITALS — BP 130/82 | HR 90 | Temp 98.4°F | Resp 18

## 2021-12-30 DIAGNOSIS — J302 Other seasonal allergic rhinitis: Secondary | ICD-10-CM

## 2021-12-30 DIAGNOSIS — J4521 Mild intermittent asthma with (acute) exacerbation: Secondary | ICD-10-CM

## 2021-12-30 DIAGNOSIS — M62838 Other muscle spasm: Secondary | ICD-10-CM

## 2021-12-30 MED ORDER — DICLOFENAC SODIUM 1 % EX GEL: 4.0000 g | Freq: Four times a day (QID) | CUTANEOUS | 2 refills | Status: AC

## 2021-12-30 MED ORDER — FLUTICASONE PROPIONATE 50 MCG/ACT NA SUSP: 1.0000 | Freq: Every day | NASAL | 1 refills | Status: AC

## 2021-12-30 MED ORDER — FEXOFENADINE HCL 180 MG PO TABS: 180.0000 mg | ORAL_TABLET | Freq: Every day | ORAL | 1 refills | Status: AC

## 2021-12-30 MED ORDER — TRIAMCINOLONE ACETONIDE 40 MG/ML IJ SUSP
40.0000 mg | Freq: Once | INTRAMUSCULAR | Status: AC
  Administered 2021-12-30: 40 mg via INTRAMUSCULAR

## 2021-12-30 MED ORDER — ALBUTEROL SULFATE HFA 108 (90 BASE) MCG/ACT IN AERS: 2.0000 | INHALATION_SPRAY | Freq: Four times a day (QID) | RESPIRATORY_TRACT | 0 refills | Status: AC | PRN

## 2021-12-30 MED ORDER — PROMETHAZINE-DM 6.25-15 MG/5ML PO SYRP: 5.0000 mL | ORAL_SOLUTION | Freq: Four times a day (QID) | ORAL | 0 refills | Status: AC | PRN

## 2021-12-30 MED ORDER — MONTELUKAST SODIUM 10 MG PO TABS: 10.0000 mg | ORAL_TABLET | Freq: Every day | ORAL | 2 refills | Status: AC

## 2021-12-30 NOTE — ED Provider Notes (Signed)
[facility]    CSN: [id number] Arrival date & time: 12/30/21  1016    HISTORY   Chief Complaint  Patient presents with   Cough    Sinus congestion,  sore throat - Entered by patient   Appointment: 1030am   HPI Mckenzie Whitehead is a 66 y.o. female. Patient presents to urgent care complaining of dry, nonproductive cough that is worse at night, sinus congestion and drainage.  Patient states she is also noticed that she has pain on the left side of her neck that seems to begin in her left ear and radiates down to her shoulder.  Patient states the cough is keeping her awake at night.  Patient denies known sick contacts, fever, aches, chills, nausea, vomiting, diarrhea, loss of taste or smell, sore throat, drainage from either ear, hearing loss.  Patient states she has a history of asthma and allergies, currently taking Allegra daily, using Flonase as needed and albuterol as needed, essentially she is only taking Allegra at this time.  Patient states she has tried Mucinex DM and Sudafed for her symptoms with short-term relief and ultimately complete return of symptoms.  The history is provided by the patient.   Past Medical History:  Diagnosis Date   Asthma    Hypertension    Patient Active Problem List   Diagnosis Date Noted   Right leg numbness 05/26/2020   Back pain 05/26/2020   Hypertension    Asthma    Near syncope    Past Surgical History:  Procedure Laterality Date   CHOLECYSTECTOMY     ELBOW SURGERY  2011   MANDIBLE SURGERY     OB History   No obstetric history on file.    Home Medications    Prior to Admission medications   Medication Sig Start Date End Date Taking? Authorizing Provider  albuterol (VENTOLIN HFA) 108 (90 Base) MCG/ACT inhaler Inhale 2 puffs into the lungs every 6 (six) hours as needed for wheezing or shortness of breath (Cough). 12/30/21  Yes Theadora Rama Scales, PA-C  fexofenadine (ALLEGRA) 180 MG tablet Take 1 tablet (180 mg  total) by mouth daily. 12/30/21 06/28/22 Yes Theadora Rama Scales, PA-C  fluticasone (FLONASE) 50 MCG/ACT nasal spray Place 1 spray into both nostrils daily. 12/30/21  Yes Theadora Rama Scales, PA-C  montelukast (SINGULAIR) 10 MG tablet Take 1 tablet (10 mg total) by mouth at bedtime. 12/30/21 03/30/22 Yes Theadora Rama Scales, PA-C  promethazine-dextromethorphan (PROMETHAZINE-DM) 6.25-15 MG/5ML syrup Take 5 mLs by mouth 4 (four) times daily as needed for cough. 12/30/21  Yes Theadora Rama Scales, PA-C  Calcium-Phosphorus-Vitamin D (CALCIUM/D3 ADULT GUMMIES PO) Take 2 each by mouth at bedtime.    [provider]  Coenzyme Q10 (COQ10) 100 MG CAPS Take 1 tablet by mouth daily.    [provider]  esomeprazole (NEXIUM) 20 MG capsule Take 20 mg by mouth daily.     [provider]  LYSINE PO Take 1 tablet by mouth at bedtime.    [provider]  Multiple Vitamins-Minerals (MULTIVITAMIN WOMEN 50+) TABS Take 1 tablet by mouth daily.    [provider]  telmisartan (MICARDIS) 40 MG tablet Take 40 mg by mouth daily. 05/12/20   [provider]  tiZANidine (ZANAFLEX) 4 MG capsule Take 4 mg by mouth at bedtime.  02/15/20   [provider]  traMADol (ULTRAM) 50 MG tablet Take 1 tablet (50 mg total) by mouth every 6 (six) hours as needed. 03/31/21   Cheree Ditto,  Sherlon Handing, PA-C  TURMERIC PO Take 2 capsules by mouth daily.     [provider]   Family History Family History  Problem Relation Age of Onset   Hypertension Mother    Diabetes Mother    Social History Social History   Tobacco Use   Smoking status: Never   Smokeless tobacco: Never  Substance Use Topics   Alcohol use: No   Drug use: Yes   Allergies   Gluten meal  Review of Systems Review of Systems Pertinent findings noted in history of present illness.   Physical Exam Triage Vital Signs ED Triage Vitals  Enc Vitals Group     BP 04/19/21 0827 (!) 147/82     Pulse Rate  04/19/21 0827 72     Resp 04/19/21 0827 18     Temp 04/19/21 0827 98.3 F (36.8 C)     Temp Source 04/19/21 0827 Oral     SpO2 04/19/21 0827 98 %     Weight --      Height --      Head Circumference --      Peak Flow --      Pain Score 04/19/21 0826 5     Pain Loc --      Pain Edu? --      Excl. in [place]? --   No data found.  Updated Vital Signs BP 130/82 (BP Location: Left Arm)   Pulse 90   Temp 98.4 F (36.9 C) (Oral)   Resp 18   SpO2 96%   Physical Exam Vitals and nursing note reviewed.  Constitutional:      General: She is not in acute distress.    Appearance: Normal appearance. She is not ill-appearing.  HENT:     Head: Normocephalic and atraumatic.     Salivary Glands: Right salivary gland is not diffusely enlarged or tender. Left salivary gland is not diffusely enlarged or tender.     Right Ear: Ear canal and external ear normal. No drainage. A middle ear effusion is present. There is no impacted cerumen. Tympanic membrane is bulging. Tympanic membrane is not injected or erythematous.     Left Ear: Ear canal and external ear normal. No drainage. A middle ear effusion is present. There is no impacted cerumen. Tympanic membrane is bulging. Tympanic membrane is not injected or erythematous.     Ears:     Comments: Bilateral EACs normal, both TMs bulging with clear fluid    Nose: Rhinorrhea present. No nasal deformity, septal deviation, signs of injury, nasal tenderness, mucosal edema or congestion. Rhinorrhea is clear.     Right Nostril: Occlusion present. No foreign body, epistaxis or septal hematoma.     Left Nostril: Occlusion present. No foreign body, epistaxis or septal hematoma.     Right Turbinates: Enlarged, swollen and pale.     Left Turbinates: Enlarged, swollen and pale.     Right Sinus: No maxillary sinus tenderness or frontal sinus tenderness.     Left Sinus: No maxillary sinus tenderness or frontal sinus tenderness.     Mouth/Throat:     Lips: Pink. No  lesions.     Mouth: Mucous membranes are moist. No oral lesions.     Pharynx: Oropharynx is clear. Uvula midline. No posterior oropharyngeal erythema or uvula swelling.     Tonsils: No tonsillar exudate. 0 on the right. 0 on the left.     Comments: Postnasal drip Eyes:     General: Lids are normal.  Right eye: No discharge.        Left eye: No discharge.     Extraocular Movements: Extraocular movements intact.     Conjunctiva/sclera: Conjunctivae normal.     Right eye: Right conjunctiva is not injected.     Left eye: Left conjunctiva is not injected.  Neck:     Trachea: Trachea and phonation normal.  Cardiovascular:     Rate and Rhythm: Normal rate and regular rhythm.     Pulses: Normal pulses.     Heart sounds: Normal heart sounds. No murmur heard.    No friction rub. No gallop.  Pulmonary:     Effort: Pulmonary effort is normal. No accessory muscle usage, prolonged expiration or respiratory distress.     Breath sounds: Normal breath sounds. No stridor, decreased air movement or transmitted upper airway sounds. No decreased breath sounds, wheezing, rhonchi or rales.  Chest:     Chest wall: No tenderness.  Musculoskeletal:        General: Normal range of motion.     Cervical back: Normal range of motion and neck supple. Spasms and tenderness present. Normal range of motion.     Comments: Spasm and tenderness of upper left trapezius  Lymphadenopathy:     Cervical: No cervical adenopathy.  Skin:    General: Skin is warm and dry.     Findings: No erythema or rash.  Neurological:     General: No focal deficit present.     Mental Status: She is alert and oriented to person, place, and time.  Psychiatric:        Mood and Affect: Mood normal.        Behavior: Behavior normal.     Visual Acuity Right Eye Distance:   Left Eye Distance:   Bilateral Distance:    Right Eye Near:   Left Eye Near:    Bilateral Near:     UC Couse / Diagnostics / Procedures:     EKG  Radiology No results found.  Procedures Procedures (including critical care time)  UC Diagnoses / Final Clinical Impressions(s)   I have reviewed the triage vital signs and the nursing notes.  Pertinent labs & imaging results that were available during my care of the patient were reviewed by me and considered in my medical decision making (see chart for details).   Final diagnoses:  Cervical paraspinal muscle spasm  Seasonal allergic rhinitis, unspecified trigger  Mild intermittent asthma with exacerbation   Patient provided with prescription for Voltaren gel and advised to use ice on neck as needed.  Patient also provided with renewals of all her allergy medications as well as albuterol.  Patient provided with a Kenalog injection during visit today rapid relief of symptoms.  Return precautions advised.  ED Prescriptions     Medication Sig Dispense Auth. Provider   fexofenadine (ALLEGRA) 180 MG tablet Take 1 tablet (180 mg total) by mouth daily. 90 tablet Lynden Oxford Scales, PA-C   fluticasone (FLONASE) 50 MCG/ACT nasal spray Place 1 spray into both nostrils daily. 47.4 mL Lynden Oxford Scales, PA-C   albuterol (VENTOLIN HFA) 108 (90 Base) MCG/ACT inhaler Inhale 2 puffs into the lungs every 6 (six) hours as needed for wheezing or shortness of breath (Cough). 18 g Lynden Oxford Scales, PA-C   promethazine-dextromethorphan (PROMETHAZINE-DM) 6.25-15 MG/5ML syrup Take 5 mLs by mouth 4 (four) times daily as needed for cough. 118 mL Lynden Oxford Scales, PA-C   montelukast (SINGULAIR) 10 MG tablet Take 1 tablet (10  mg total) by mouth at bedtime. 30 tablet Theadora Rama Scales, PA-C   diclofenac Sodium (VOLTAREN) 1 % GEL Apply 4 g topically 4 (four) times daily. Apply to affected areas 4 times daily as needed for pain. 100 g Theadora Rama Scales, PA-C      PDMP not reviewed this encounter.  Pending results:  Labs Reviewed - No data to display  Medications Ordered in  UC: Medications  triamcinolone acetonide (KENALOG-40) injection 40 mg (has no administration in time range)    Disposition Upon Discharge:  Condition: stable for discharge home Home: take medications as prescribed; routine discharge instructions as discussed; follow up as advised.  Patient presented with an acute illness with associated systemic symptoms and significant discomfort requiring urgent management. In my opinion, this is a condition that a prudent lay person (someone who possesses an average knowledge of health and medicine) may potentially expect to result in complications if not addressed urgently such as respiratory distress, impairment of bodily function or dysfunction of bodily organs.   Routine symptom specific, illness specific and/or disease specific instructions were discussed with the patient and/or caregiver at length.   As such, the patient has been evaluated and assessed, work-up was performed and treatment was provided in alignment with urgent care protocols and evidence based medicine.  Patient/parent/caregiver has been advised that the patient may require follow up for further testing and treatment if the symptoms continue in spite of treatment, as clinically indicated and appropriate.  If the patient was tested for COVID-19, Influenza and/or RSV, then the patient/parent/guardian was advised to isolate at home pending the results of his/her diagnostic coronavirus test and potentially longer if they're positive. I have also advised pt that if his/her COVID-19 test returns positive, it's recommended to self-isolate for at least 10 days after symptoms first appeared AND until fever-free for 24 hours without fever reducer AND other symptoms have improved or resolved. Discussed self-isolation recommendations as well as instructions for household member/close contacts as per the [facility] and [place] [facility], and also gave patient the COVID packet with this  information.  Patient/parent/caregiver has been advised to return to the [facility] or PCP in 3-5 days if no better; to PCP or the Emergency Department if new signs and symptoms develop, or if the current signs or symptoms continue to change or worsen for further workup, evaluation and treatment as clinically indicated and appropriate  The patient will follow up with their current PCP if and as advised. If the patient does not currently have a PCP we will assist them in obtaining one.   The patient may need specialty follow up if the symptoms continue, in spite of conservative treatment and management, for further workup, evaluation, consultation and treatment as clinically indicated and appropriate.  Patient/parent/caregiver verbalized understanding and agreement of plan as discussed.  All questions were addressed during visit.  Please see discharge instructions below for further details of plan.  Discharge Instructions:   Discharge Instructions      Your symptoms and my physical exam findings are concerning for exacerbation of your underlying allergies.     Please see the list below for recommended medications, dosages and frequencies to provide relief of current symptoms:     Kenalog IM (triamcinolone):  To quickly address your significant respiratory inflammation, you were provided with an injection of Kenalog in the office today.  You should continue to feel the full benefit of the steroid for the next 24-36 hours.  Kenalog will also reduce the inflammation in  the muscles in your neck thereby reducing your pain.   Allegra (fexofenadine): This is an excellent second-generation antihistamine that helps to reduce respiratory inflammatory response to environmental allergens.  This medication is not known to cause daytime sleepiness so it can be taken in the daytime.  If you find that it does make you sleepy, please feel free to take it at bedtime.   Singulair (montelukast): This is a mast cell  stabilizer that works well with antihistamines.  Mast cells are responsible for stimulating histamine production so you can imagine that if we can reduce the activity of your mast cells, then fewer histamines will be produced and inflammation caused by allergy exposure will be significantly reduced.  I recommend that you take this medication at the same time you take your antihistamine.   Flonase (fluticasone): This is a steroid nasal spray that you use once daily, 1 spray in each nare.  This medication does not work well if you decide to use it only used as you feel you need to, it works best used on a daily basis.  After 3 to 5 days of use, you will notice significant reduction of the inflammation and mucus production that is currently being caused by exposure to allergens, whether seasonal or environmental.  The most common side effect of this medication is nosebleeds.  If you experience a nosebleed, please discontinue use for 1 week, then feel free to resume.  I have provided you with a prescription.     ProAir, Ventolin, Proventil (albuterol): This inhaled medication contains a short acting beta agonist bronchodilator.  This medication works on the smooth muscle that opens and constricts of your airways by relaxing the muscle.  The result of relaxation of the smooth muscle is increased air movement and improved work of breathing.  This is a short acting medication that can be used every 4-6 hours as needed for increased work of breathing, shortness of breath, wheezing and excessive coughing.  I have provided you with a prescription.   Promethazine DM: Promethazine is both a nasal decongestant and an antinausea medication that makes most patients feel fairly sleepy.  The DM is dextromethorphan, a cough suppressant found in many over-the-counter cough medications.  Please take 5 mL before bedtime to minimize your cough which will help you sleep better.  I have sent a prescription for this medication to your  pharmacy.  Voltaren gel (diclofenac gel): This is a topical anti-inflammatory that you can use 4 times daily on sore, aching muscles.  They may also wish to consider applying ice pack to the affected area as well to help reduce inflammation.  If you do not have an ice pack at home, you can use a gallon sized Ziploc bag and fill with equal parts of abdominal dish detergent and rubbing alcohol then freeze, use, repeat.   If your insurance will not cover your allergy medications, please consider downloading the Good Rx app which is free.  You can find considerable discounts on prescription and over-the-counter medications.   If you find that you have not had improvement of your symptoms in the next 5 to 7 days, please follow-up with your primary care provider or return here to urgent care for repeat evaluation and further recommendations.   Thank you for visiting urgent care today.  We appreciate the opportunity to participate in your care.     This office note has been dictated using Museum/gallery curator.  Unfortunately, and despite my best efforts, this  method of dictation can sometimes lead to occasional typographical or grammatical errors.  I apologize in advance if this occurs.     Lynden Oxford Scales, PA-C 12/30/21 1136

## 2021-12-30 NOTE — ED Triage Notes (Signed)
Patient presents to [facility] for evaluation of cough, sinus congestiomn starting 5 days ago, has had pain and swelling to the left ear/neck.  PAtient states the cough is keeping her up.  Patient denies being around anyone has been sick.

## 2021-12-30 NOTE — Discharge Instructions (Addendum)
Your symptoms and my physical exam findings are concerning for exacerbation of your underlying allergies.     Please see the list below for recommended medications, dosages and frequencies to provide relief of current symptoms:     Kenalog IM (triamcinolone):  To quickly address your significant respiratory inflammation, you were provided with an injection of Kenalog in the office today.  You should continue to feel the full benefit of the steroid for the next 24-36 hours.  Kenalog will also reduce the inflammation in the muscles in your neck thereby reducing your pain.   Allegra (fexofenadine): This is an excellent second-generation antihistamine that helps to reduce respiratory inflammatory response to environmental allergens.  This medication is not known to cause daytime sleepiness so it can be taken in the daytime.  If you find that it does make you sleepy, please feel free to take it at bedtime.   Singulair (montelukast): This is a mast cell stabilizer that works well with antihistamines.  Mast cells are responsible for stimulating histamine production so you can imagine that if we can reduce the activity of your mast cells, then fewer histamines will be produced and inflammation caused by allergy exposure will be significantly reduced.  I recommend that you take this medication at the same time you take your antihistamine.   Flonase (fluticasone): This is a steroid nasal spray that you use once daily, 1 spray in each nare.  This medication does not work well if you decide to use it only used as you feel you need to, it works best used on a daily basis.  After 3 to 5 days of use, you will notice significant reduction of the inflammation and mucus production that is currently being caused by exposure to allergens, whether seasonal or environmental.  The most common side effect of this medication is nosebleeds.  If you experience a nosebleed, please discontinue use for 1 week, then feel free to resume.   I have provided you with a prescription.     ProAir, Ventolin, Proventil (albuterol): This inhaled medication contains a short acting beta agonist bronchodilator.  This medication works on the smooth muscle that opens and constricts of your airways by relaxing the muscle.  The result of relaxation of the smooth muscle is increased air movement and improved work of breathing.  This is a short acting medication that can be used every 4-6 hours as needed for increased work of breathing, shortness of breath, wheezing and excessive coughing.  I have provided you with a prescription.   Promethazine DM: Promethazine is both a nasal decongestant and an antinausea medication that makes most patients feel fairly sleepy.  The DM is dextromethorphan, a cough suppressant found in many over-the-counter cough medications.  Please take 5 mL before bedtime to minimize your cough which will help you sleep better.  I have sent a prescription for this medication to your pharmacy.  Voltaren gel (diclofenac gel): This is a topical anti-inflammatory that you can use 4 times daily on sore, aching muscles.  They may also wish to consider applying ice pack to the affected area as well to help reduce inflammation.  If you do not have an ice pack at home, you can use a gallon sized Ziploc bag and fill with equal parts of abdominal dish detergent and rubbing alcohol then freeze, use, repeat.   If your insurance will not cover your allergy medications, please consider downloading the Good Rx app which is free.  You can find considerable discounts  on prescription and over-the-counter medications.   If you find that you have not had improvement of your symptoms in the next 5 to 7 days, please follow-up with your primary care provider or return here to urgent care for repeat evaluation and further recommendations.   Thank you for visiting urgent care today.  We appreciate the opportunity to participate in your care.
